# Patient Record
Sex: Female | Born: 1955 | Race: Black or African American | Hispanic: No | Smoking: Former smoker
Health system: Southern US, Community
[De-identification: ages and names within clinical notes are randomized; demographics above are authoritative.]

## PROBLEM LIST (undated history)

## (undated) DIAGNOSIS — I10 Essential (primary) hypertension: Secondary | ICD-10-CM

## (undated) DIAGNOSIS — E669 Obesity, unspecified: Secondary | ICD-10-CM

## (undated) DIAGNOSIS — E119 Type 2 diabetes mellitus without complications: Secondary | ICD-10-CM

## (undated) DIAGNOSIS — J449 Chronic obstructive pulmonary disease, unspecified: Secondary | ICD-10-CM

## (undated) DIAGNOSIS — J45909 Unspecified asthma, uncomplicated: Secondary | ICD-10-CM

## (undated) HISTORY — PX: DILATION AND CURETTAGE OF UTERUS: SHX78

---

## 2014-11-06 ENCOUNTER — Emergency Department (HOSPITAL_COMMUNITY): Payer: Medicaid Other

## 2014-11-06 ENCOUNTER — Encounter (HOSPITAL_COMMUNITY): Payer: Self-pay | Admitting: Emergency Medicine

## 2014-11-06 DIAGNOSIS — Z87891 Personal history of nicotine dependence: Secondary | ICD-10-CM | POA: Insufficient documentation

## 2014-11-06 DIAGNOSIS — R000 Tachycardia, unspecified: Secondary | ICD-10-CM | POA: Diagnosis not present

## 2014-11-06 DIAGNOSIS — Z862 Personal history of diseases of the blood and blood-forming organs and certain disorders involving the immune mechanism: Secondary | ICD-10-CM | POA: Diagnosis not present

## 2014-11-06 DIAGNOSIS — J441 Chronic obstructive pulmonary disease with (acute) exacerbation: Secondary | ICD-10-CM | POA: Insufficient documentation

## 2014-11-06 DIAGNOSIS — E119 Type 2 diabetes mellitus without complications: Secondary | ICD-10-CM | POA: Diagnosis not present

## 2014-11-06 DIAGNOSIS — Z7952 Long term (current) use of systemic steroids: Secondary | ICD-10-CM | POA: Diagnosis not present

## 2014-11-06 DIAGNOSIS — R0602 Shortness of breath: Secondary | ICD-10-CM | POA: Diagnosis present

## 2014-11-06 DIAGNOSIS — E669 Obesity, unspecified: Secondary | ICD-10-CM | POA: Insufficient documentation

## 2014-11-06 DIAGNOSIS — R112 Nausea with vomiting, unspecified: Secondary | ICD-10-CM | POA: Insufficient documentation

## 2014-11-06 DIAGNOSIS — I10 Essential (primary) hypertension: Secondary | ICD-10-CM | POA: Insufficient documentation

## 2014-11-06 LAB — CBC WITH DIFFERENTIAL/PLATELET
BASOS PCT: 0 %
Basophils Absolute: 0.1 10*3/uL (ref 0.0–0.1)
EOS ABS: 0.1 10*3/uL (ref 0.0–0.7)
Eosinophils Relative: 1 %
HCT: 45.1 % (ref 36.0–46.0)
HEMOGLOBIN: 15 g/dL (ref 12.0–15.0)
LYMPHS ABS: 1.4 10*3/uL (ref 0.7–4.0)
Lymphocytes Relative: 6 %
MCH: 27.6 pg (ref 26.0–34.0)
MCHC: 33.3 g/dL (ref 30.0–36.0)
MCV: 83.1 fL (ref 78.0–100.0)
MONO ABS: 1.5 10*3/uL — AB (ref 0.1–1.0)
MONOS PCT: 7 %
NEUTROS PCT: 86 %
Neutro Abs: 19.1 10*3/uL — ABNORMAL HIGH (ref 1.7–7.7)
Platelets: 221 10*3/uL (ref 150–400)
RBC: 5.43 MIL/uL — ABNORMAL HIGH (ref 3.87–5.11)
RDW: 15.4 % (ref 11.5–15.5)
WBC: 22.1 10*3/uL — ABNORMAL HIGH (ref 4.0–10.5)

## 2014-11-06 LAB — BASIC METABOLIC PANEL
Anion gap: 13 (ref 5–15)
BUN: 15 mg/dL (ref 6–20)
CALCIUM: 8.8 mg/dL — AB (ref 8.9–10.3)
CHLORIDE: 96 mmol/L — AB (ref 101–111)
CO2: 23 mmol/L (ref 22–32)
CREATININE: 0.81 mg/dL (ref 0.44–1.00)
GFR calc Af Amer: >60 mL/min (ref >60)
GFR calc non Af Amer: >60 mL/min (ref >60)
GLUCOSE: 317 mg/dL — AB (ref 65–99)
Potassium: 4.5 mmol/L (ref 3.5–5.1)
Sodium: 132 mmol/L — ABNORMAL LOW (ref 135–145)

## 2014-11-06 NOTE — ED Notes (Signed)
Pt. presents with multiple complaints : SOB with productive cough , nausea , emesis , diarrhea , pain at buttocks and right facial numbness  onset this afternoon . Denies fever or chills , alert and oriented . No facial symmetry /speech clear.

## 2014-11-07 ENCOUNTER — Emergency Department (HOSPITAL_COMMUNITY)
Admission: EM | Admit: 2014-11-07 | Discharge: 2014-11-07 | Disposition: A | Discharge status: Home / Self Care | Payer: Medicaid Other | Attending: Emergency Medicine | Admitting: Emergency Medicine

## 2014-11-07 DIAGNOSIS — J441 Chronic obstructive pulmonary disease with (acute) exacerbation: Secondary | ICD-10-CM

## 2014-11-07 HISTORY — DX: Essential (primary) hypertension: I10

## 2014-11-07 HISTORY — DX: Chronic obstructive pulmonary disease, unspecified: J44.9

## 2014-11-07 HISTORY — DX: Unspecified asthma, uncomplicated: J45.909

## 2014-11-07 HISTORY — DX: Obesity, unspecified: E66.9

## 2014-11-07 HISTORY — DX: Type 2 diabetes mellitus without complications: E11.9

## 2014-11-07 MED ORDER — IPRATROPIUM BROMIDE 0.02 % IN SOLN
1.0000 mg | Freq: Once | RESPIRATORY_TRACT | Status: AC
  Administered 2014-11-07: 1 mg via RESPIRATORY_TRACT
  Filled 2014-11-07: qty 5

## 2014-11-07 MED ORDER — AZITHROMYCIN 250 MG PO TABS
500.0000 mg | ORAL_TABLET | Freq: Once | ORAL | Status: AC
  Administered 2014-11-07: 500 mg via ORAL
  Filled 2014-11-07: qty 2

## 2014-11-07 MED ORDER — PREDNISONE 20 MG PO TABS
60.0000 mg | ORAL_TABLET | Freq: Once | ORAL | Status: AC
  Administered 2014-11-07: 60 mg via ORAL
  Filled 2014-11-07: qty 3

## 2014-11-07 MED ORDER — SODIUM CHLORIDE 0.9 % IV BOLUS (SEPSIS)
1000.0000 mL | Freq: Once | INTRAVENOUS | Status: AC
  Administered 2014-11-07: 1000 mL via INTRAVENOUS

## 2014-11-07 MED ORDER — ONDANSETRON HCL 4 MG/2ML IJ SOLN
4.0000 mg | Freq: Once | INTRAMUSCULAR | Status: AC
  Administered 2014-11-07: 4 mg via INTRAVENOUS
  Filled 2014-11-07: qty 2

## 2014-11-07 MED ORDER — MAGNESIUM SULFATE 2 GM/50ML IV SOLN
2.0000 g | Freq: Once | INTRAVENOUS | Status: AC
  Administered 2014-11-07: 2 g via INTRAVENOUS
  Filled 2014-11-07: qty 50

## 2014-11-07 MED ORDER — AZITHROMYCIN 250 MG PO TABS: 250.0000 mg | ORAL_TABLET | Freq: Every day | ORAL | Status: DC

## 2014-11-07 MED ORDER — PREDNISONE 20 MG PO TABS: 60.0000 mg | ORAL_TABLET | Freq: Every day | ORAL | Status: DC

## 2014-11-07 MED ORDER — ALBUTEROL (5 MG/ML) CONTINUOUS INHALATION SOLN
10.0000 mg/h | INHALATION_SOLUTION | RESPIRATORY_TRACT | Status: DC
  Administered 2014-11-07: 10 mg/h via RESPIRATORY_TRACT
  Filled 2014-11-07: qty 20

## 2014-11-07 NOTE — ED Provider Notes (Addendum)
CSN: [id number]     Arrival date & time 11/06/14  2204 History  By signing my name below, I, Cassandra Shea, attest that this documentation has been prepared under the direction and in the presence of [name], MD. Electronically Signed: Budd Shea, ED Scribe. 11/07/2014. 1:29 AM.    Chief Complaint  Patient presents with  . Shortness of Breath  . Emesis  . Diarrhea   The history is provided by the patient. No language interpreter was used.   HPI Comments: Cassandra Shea is a 59 y.o. female former smoker with a PMHX of DM, HTN, COPD, and asthma who presents to the Emergency Department complaining of worsening SOB, emesis (1x yesterday), and nausea onset today. She reports associated cough (worse than usual) and mild central chest tightness. She states she is not sure if this is her COPD flaring up. 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 She states she takes prednisone once daily. She reports having used her inhaler today. She notes she is not on oxygen at home. Pt denies diaphoresis.  Past Medical History  Diagnosis Date  . COPD (chronic obstructive pulmonary disease) (HCC)   . Diabetes mellitus without complication (HCC)   . Hypertension   . Asthma   . Obesity    Past Surgical History  Procedure Laterality Date  . Dilation and curettage of uterus     No family history on file. Social History  Substance Use Topics  . Smoking status: Former Games developer  . Smokeless tobacco: None  . Alcohol Use: No   OB History    No data available     Review of Systems A complete 10 system review of systems was obtained and all systems are negative except as noted in the HPI and PMH.   Allergies  Review of patient's allergies indicates no known allergies.  Home Medications   Prior to Admission medications   Not on File   BP 143/90 mmHg  Pulse 114  Temp(Src) 99.4 F (37.4 C) (Oral)  Resp 18  Ht  (1.499 m)  Wt  227 lb (102.967 kg)  BMI 45.82 kg/m2  SpO2 95% Physical Exam  Constitutional: She is oriented to person, place, and time. She appears well-developed and well-nourished. No distress.  HENT:  Head: Normocephalic and atraumatic.  Nose: Nose normal.  Mouth/Throat: Oropharynx is clear and moist. No oropharyngeal exudate.  Eyes: Conjunctivae and EOM are normal. Pupils are equal, round, and reactive to light. No scleral icterus.  Neck: Normal range of motion. Neck supple. No JVD present. No tracheal deviation present. No thyromegaly present.  Cardiovascular: Regular rhythm.  Exam reveals no gallop and no friction rub.   No murmur heard. Tachycardic  Pulmonary/Chest: Effort normal. No respiratory distress. She has wheezes. She exhibits no tenderness.  Diffuse rhonchi and wheezing in all lung fields  Abdominal: Soft. Bowel sounds are normal. She exhibits no distension and no mass. There is no tenderness. There is no rebound and no guarding.  Musculoskeletal: Normal range of motion. She exhibits no edema or tenderness.  Lymphadenopathy:    She has no cervical adenopathy.  Neurological: She is alert and oriented to person, place, and time. No cranial nerve deficit. She exhibits normal muscle tone.  Skin: Skin is warm and dry. No rash noted. No erythema. No pallor.  Nursing note and vitals reviewed.   ED Course  Procedures  DIAGNOSTIC STUDIES: Oxygen Saturation is 95%  on RA, adequate by my interpretation.    COORDINATION OF CARE: 1:21 AM - Discussed plans to order a breathing treatment and increase prednisone to 60 mg for 6 days. Pt advised of plan for treatment and pt agrees.  Labs Review Labs Reviewed  BASIC METABOLIC PANEL - Abnormal; Notable for the following:    Sodium 132 (*)    Chloride 96 (*)    Glucose, Bld 317 (*)    Calcium 8.8 (*)    All other components within normal limits  CBC WITH DIFFERENTIAL/PLATELET - Abnormal; Notable for the following:    WBC 22.1 (*)    RBC 5.43  (*)    Neutro Abs 19.1 (*)    Monocytes Absolute 1.5 (*)    All other components within normal limits    Imaging Review Dg Chest 2 View  11/06/2014  CLINICAL DATA:  Shortness of Breath EXAM: CHEST - 2 VIEW COMPARISON:  None. FINDINGS: The heart size and mediastinal contours are within normal limits. Both lungs are clear. The visualized skeletal structures are unremarkable. IMPRESSION: No active disease. Electronically Signed   By: [name]  [name]   On: 11/06/2014 23:19   I have personally reviewed and evaluated these images and lab results as part of my medical decision-making.   EKG Interpretation   Date/Time:  Friday November 07 2014 01:06:55 EDT Ventricular Rate:  115 PR Interval:  161 QRS Duration: 83 QT Interval:  346 QTC Calculation: 479 R Axis:   95 Text Interpretation:  Sinus tachycardia Anterior infarct, old No  significant change since last tracing Confirmed by [name], [name] Ayokunle  [phone]([id number]) on 11/07/2014 1:54:05 AM      MDM   Final diagnoses:  None    Patient presents to the ED for worsening SOB she feels is consistent with her COPD.  Physical exam supports this.  She was given 1hr albuterol, ipratropium, magnesium, IVF, prednisone and azithromycin.  Upon repeat evaluation, wheezing and rhonchi have significantly improved although still present.  She appears well and states she is at her baseline. No hypoxia in the ED.  Will Dc home with prednisone and zpack course.  Her VS remain within her normal limits and she is safe for DC. Patient now tachycardic, likely from albuterol  CRITICAL CARE Performed by: Tomasita CrumbleNI,Marieke Lubke   Total critical care time: 35min - respiratory distress  Critical care time was exclusive of separately billable procedures and treating other patients.  Critical care was necessary to treat or prevent imminent or life-threatening deterioration.  Critical care was time spent personally by me on the following activities: development of treatment  plan with patient and/or surrogate as well as nursing, discussions with consultants, evaluation of patient's response to treatment, examination of patient, obtaining history from patient or surrogate, ordering and performing treatments and interventions, ordering and review of laboratory studies, ordering and review of radiographic studies, pulse oximetry and re-evaluation of patient's condition.  I, Abdulahad Mederos, personally performed the services described in this documentation. All medical record entries made by the scribe were at my direction and in my presence.  I have reviewed the chart and discharge instructions and agree that the record reflects my personal performance and is accurate and complete. Orhan Mayorga.  11/07/2014. 4:30 AM.     Tomasita CrumbleAdeleke Ranie Chinchilla, MD 11/07/14 [id number]  Tomasita CrumbleAdeleke Nassir Neidert, MD 11/07/14 [phone]

## 2014-11-07 NOTE — Discharge Instructions (Signed)
Chronic Obstructive Pulmonary Disease Exacerbation Cassandra Shea, See your primary care doctor within 3 days.  Continue albuterol inhaler every 6 hours for the next 2 days.  Take prednisone and azithromycin as prescribed.  If symptoms worsen, come back to the ED immediately.  Thank you. Chronic obstructive pulmonary disease (COPD) is a common lung problem. In COPD, the flow of air from the lungs is limited. COPD exacerbations are times that breathing gets worse and you need extra treatment. Without treatment they can be life threatening. If they happen often, your lungs can become more damaged. If your COPD gets worse, your doctor may treat you with:  Medicines.  Oxygen.  Different ways to clear your airway, such as using a mask. HOME CARE  Do not smoke.  Avoid tobacco smoke and other things that bother your lungs.  If given, take your antibiotic medicine as told. Finish the medicine even if you start to feel better.  Only take medicines as told by your doctor.  Drink enough fluids to keep your pee (urine) clear or pale yellow (unless your doctor has told you not to).  Use a cool mist machine (vaporizer).  If you use oxygen or a machine that turns liquid medicine into a mist (nebulizer), continue to use them as told.  Keep up with shots (vaccinations) as told by your doctor.  Exercise regularly.  Eat healthy foods.  Keep all doctor visits as told. GET HELP RIGHT AWAY IF:  You are very short of breath and it gets worse.  You have trouble talking.  You have bad chest pain.  You have blood in your spit (sputum).  You have a fever.  You keep throwing up (vomiting).  You feel weak, or you pass out (faint).  You feel confused.  You keep getting worse. MAKE SURE YOU:  Understand these instructions.  Will watch your condition.  Will get help right away if you are not doing well or get worse.   This information is not intended to replace advice given to you by your  health care provider. Make sure you discuss any questions you have with your health care provider.   Document Released: 12/23/2010 Document Revised: 01/24/2014 Document Reviewed: 09/07/2012 Elsevier Interactive Patient Education [employer]2016 [employer] Inc.

## 2014-11-07 NOTE — ED Notes (Signed)
IV attempt X2 

## 2014-11-07 NOTE — ED Notes (Signed)
Reviewed discharge instructions and prescription medications with patient. Pt verbalized understanding.

## 2014-11-14 ENCOUNTER — Inpatient Hospital Stay: Payer: Medicaid Other | Admitting: Family Medicine

## 2019-04-18 ENCOUNTER — Emergency Department (HOSPITAL_COMMUNITY): Payer: Medicare Other

## 2019-04-18 ENCOUNTER — Observation Stay (HOSPITAL_COMMUNITY)
Admission: EM | Admit: 2019-04-18 | Discharge: 2019-04-19 | Disposition: A | Discharge status: Home / Self Care | Payer: Medicare Other | Attending: Family Medicine | Admitting: Family Medicine

## 2019-04-18 ENCOUNTER — Other Ambulatory Visit: Payer: Self-pay

## 2019-04-18 DIAGNOSIS — I160 Hypertensive urgency: Secondary | ICD-10-CM | POA: Insufficient documentation

## 2019-04-18 DIAGNOSIS — Z7984 Long term (current) use of oral hypoglycemic drugs: Secondary | ICD-10-CM | POA: Insufficient documentation

## 2019-04-18 DIAGNOSIS — Z20822 Contact with and (suspected) exposure to covid-19: Secondary | ICD-10-CM | POA: Diagnosis not present

## 2019-04-18 DIAGNOSIS — I719 Aortic aneurysm of unspecified site, without rupture: Secondary | ICD-10-CM | POA: Diagnosis not present

## 2019-04-18 DIAGNOSIS — Z87891 Personal history of nicotine dependence: Secondary | ICD-10-CM | POA: Diagnosis not present

## 2019-04-18 DIAGNOSIS — E119 Type 2 diabetes mellitus without complications: Secondary | ICD-10-CM | POA: Insufficient documentation

## 2019-04-18 DIAGNOSIS — M542 Cervicalgia: Secondary | ICD-10-CM | POA: Diagnosis present

## 2019-04-18 DIAGNOSIS — I771 Stricture of artery: Secondary | ICD-10-CM | POA: Diagnosis not present

## 2019-04-18 DIAGNOSIS — Z79899 Other long term (current) drug therapy: Secondary | ICD-10-CM | POA: Diagnosis not present

## 2019-04-18 DIAGNOSIS — J449 Chronic obstructive pulmonary disease, unspecified: Secondary | ICD-10-CM | POA: Diagnosis not present

## 2019-04-18 DIAGNOSIS — I701 Atherosclerosis of renal artery: Secondary | ICD-10-CM

## 2019-04-18 DIAGNOSIS — I10 Essential (primary) hypertension: Secondary | ICD-10-CM | POA: Diagnosis not present

## 2019-04-18 LAB — BASIC METABOLIC PANEL
Anion gap: 12 (ref 5–15)
BUN: 13 mg/dL (ref 8–23)
CO2: 24 mmol/L (ref 22–32)
Calcium: 9.4 mg/dL (ref 8.9–10.3)
Chloride: 99 mmol/L (ref 98–111)
Creatinine, Ser: 0.96 mg/dL (ref 0.44–1.00)
GFR calc Af Amer: >60 mL/min (ref >60)
GFR calc non Af Amer: >60 mL/min (ref >60)
Glucose, Bld: 313 mg/dL — ABNORMAL HIGH (ref 70–99)
Potassium: 4.4 mmol/L (ref 3.5–5.1)
Sodium: 135 mmol/L (ref 135–145)

## 2019-04-18 LAB — CBC
HCT: 40.9 % (ref 36.0–46.0)
Hemoglobin: 12.7 g/dL (ref 12.0–15.0)
MCH: 25.2 pg — ABNORMAL LOW (ref 26.0–34.0)
MCHC: 31.1 g/dL (ref 30.0–36.0)
MCV: 81.3 fL (ref 80.0–100.0)
Platelets: 291 10*3/uL (ref 150–400)
RBC: 5.03 MIL/uL (ref 3.87–5.11)
RDW: 14.9 % (ref 11.5–15.5)
WBC: 17.6 10*3/uL — ABNORMAL HIGH (ref 4.0–10.5)
nRBC: 0 % (ref 0.0–0.2)

## 2019-04-18 LAB — TROPONIN I (HIGH SENSITIVITY)
Troponin I (High Sensitivity): 5 ng/L (ref <18)
Troponin I (High Sensitivity): 6 ng/L (ref <18)

## 2019-04-18 MED ORDER — LABETALOL HCL 5 MG/ML IV SOLN
10.0000 mg | Freq: Once | INTRAVENOUS | Status: AC
  Administered 2019-04-18: 10 mg via INTRAVENOUS
  Filled 2019-04-18: qty 4

## 2019-04-18 MED ORDER — METHOCARBAMOL 500 MG PO TABS
500.0000 mg | ORAL_TABLET | Freq: Once | ORAL | Status: AC
Start: 2019-04-18 — End: 2019-04-18
  Administered 2019-04-18: 500 mg via ORAL
  Filled 2019-04-18: qty 1

## 2019-04-18 MED ORDER — FENTANYL CITRATE (PF) 100 MCG/2ML IJ SOLN
50.0000 ug | Freq: Once | INTRAMUSCULAR | Status: AC
  Administered 2019-04-18: 50 ug via INTRAVENOUS
  Filled 2019-04-18: qty 2

## 2019-04-18 MED ORDER — HYDROMORPHONE HCL 1 MG/ML IJ SOLN
0.5000 mg | Freq: Once | INTRAMUSCULAR | Status: AC
  Administered 2019-04-18: 0.5 mg via INTRAVENOUS
  Filled 2019-04-18: qty 1

## 2019-04-18 NOTE — ED Triage Notes (Signed)
Pt reports neck pain/upper back pain and spasms X3 days. Pt noted to be hypertensive in triage. Denies CP/SOB.

## 2019-04-18 NOTE — ED Provider Notes (Signed)
[facility] EMERGENCY DEPARTMENT Provider Note   CSN: [id number] Arrival date & time: 04/18/19  1744     History Chief Complaint  Patient presents with  . Neck Pain    Cassandra Shea is a 64 y.o. female.  The history is provided by the patient.  Neck Pain Associated symptoms: no weakness   Patient presents with neck pain.  Has had for around the last 3 days.  And upper neck and to the left side.  Worse with moving her head around.  No trauma.  No numbness or weakness.  Has had some muscle spasm in the past and feels it feels like this.  No chest pain.  No trouble breathing.  However does have it elevated blood pressure.  Does have a history of hypertension.  States she has been taking her medicines.  Has taken Tylenol and Advil at home without relief of the pain.     Past Medical History:  Diagnosis Date  . Asthma   . COPD (chronic obstructive pulmonary disease) (HCC)   . Diabetes mellitus without complication (HCC)   . Hypertension   . Obesity     There are no problems to display for this patient.   Past Surgical History:  Procedure Laterality Date  . DILATION AND CURETTAGE OF UTERUS       OB History   No obstetric history on file.     No family history on file.  Social History   Tobacco Use  . Smoking status: Former Smoker  Substance Use Topics  . Alcohol use: No  . Drug use: No    Home Medications Prior to Admission medications   Medication Sig Start Date End Date Taking? Authorizing Provider  carvedilol (COREG) 25 MG tablet Take 25 mg by mouth 2 (two) times daily. 10/12/14  Yes [provider]  gabapentin (NEURONTIN) 300 MG capsule Take 300 mg by mouth 3 (three) times daily. 10/15/14  Yes [provider]  glipiZIDE (GLUCOTROL XL) 10 MG 24 hr tablet Take 10 mg by mouth 2 (two) times daily. 04/01/19  Yes [provider]  hydrALAZINE (APRESOLINE) 10 MG tablet Take 10 mg by mouth 3 (three) times daily. 10/07/14   Yes [provider]  metFORMIN (GLUCOPHAGE) 1000 MG tablet Take 1,000 mg by mouth 2 (two) times daily. 09/26/14  Yes [provider]  PROAIR HFA 108 (90 BASE) MCG/ACT inhaler Take 2 puffs by mouth every 6 (six) hours as needed for wheezing.  10/30/14  Yes [provider]  QVAR 80 MCG/ACT inhaler Take 1 puff by mouth 2 (two) times daily. 10/29/14  Yes [provider]  rosuvastatin (CRESTOR) 10 MG tablet Take 10 mg by mouth daily. 01/31/19  Yes [provider]  TRADJENTA 5 MG TABS tablet Take 5 mg by mouth daily. 04/02/19  Yes [provider]  azithromycin (ZITHROMAX) 250 MG tablet Take 1 tablet (250 mg total) by mouth daily. Patient not taking: Reported on 04/18/2019 11/07/14   [name], MD  glipiZIDE (GLUCOTROL) 10 MG tablet Take 10 mg by mouth 2 (two) times daily. 10/07/14   [provider]  predniSONE (DELTASONE) 20 MG tablet Take 3 tablets (60 mg total) by mouth daily. Patient not taking: Reported on 04/18/2019 11/07/14   [name], MD    Allergies    Patient has no known allergies.  Review of Systems   Review of Systems  Constitutional: Negative for appetite change.  HENT: Negative for congestion and dental problem.  Respiratory: Negative for shortness of breath.   Gastrointestinal: Negative for abdominal distention.  Genitourinary: Negative for flank pain.  Musculoskeletal: Positive for neck pain.  Skin: Negative for pallor.  Neurological: Negative for weakness.  Psychiatric/Behavioral: Negative for confusion.    Physical Exam Updated Vital Signs BP (!) 199/102   Pulse (!) 105   Temp 98.9 F (37.2 C) (Oral)   Resp 20   Ht 4\' 11"  (1.499 m)   Wt 103 kg   SpO2 94%   BMI 45.85 kg/m   Physical Exam Vitals and nursing note reviewed.  HENT:     Head: Normocephalic.  Eyes:     Pupils: Pupils are equal, round, and reactive to light.  Cardiovascular:     Rate and Rhythm: Regular rhythm.  Pulmonary:     Breath  sounds: No wheezing or rhonchi.  Abdominal:     Tenderness: There is no abdominal tenderness.  Musculoskeletal:     Comments: Tenderness over upper cervical spine paraspinal area.  Some pain with range of motion.  No meningismus.  Skin:    General: Skin is warm.  Neurological:     General: No focal deficit present.     Mental Status: She is alert and oriented to person, place, and time.     ED Results / Procedures / Treatments   Labs (all labs ordered are listed, but only abnormal results are displayed) Labs Reviewed  BASIC METABOLIC PANEL - Abnormal; Notable for the following components:      Result Value   Glucose, Bld 313 (*)    All other components within normal limits  CBC - Abnormal; Notable for the following components:   WBC 17.6 (*)    MCH 25.2 (*)    All other components within normal limits  TROPONIN I (HIGH SENSITIVITY)  TROPONIN I (HIGH SENSITIVITY)    EKG EKG Interpretation  Date/Time:  Thursday April 18 2019 17:46:40 EDT Ventricular Rate:  110 PR Interval:  178 QRS Duration: 98 QT Interval:  338 QTC Calculation: 457 R Axis:   95 Text Interpretation: Sinus tachycardia Rightward axis Possible Anterior infarct , age undetermined Abnormal ECG Confirmed by 11-06-2004 [phone]) on 04/18/2019 6:52:16 PM   Radiology DG Chest 2 View  Result Date: 04/18/2019 CLINICAL DATA:  Hypertension and neck pain EXAM: CHEST - 2 VIEW COMPARISON:  03/19/2017 FINDINGS: Cardiac shadow is stable. Aortic calcifications are again seen. The lungs are well aerated bilaterally. No focal infiltrate or sizable effusion is seen. No acute bony abnormality is noted. IMPRESSION: No acute abnormality noted. Aortic Atherosclerosis (ICD10-I70.0). Electronically Signed   By: 05/19/2017 M.D.   On: 04/18/2019 19:30    Procedures Procedures (including critical care time)  Medications Ordered in ED Medications  labetalol (NORMODYNE) injection 10 mg (has no administration in time range)   methocarbamol (ROBAXIN) tablet 500 mg (500 mg Oral Given 04/18/19 1937)  fentaNYL (SUBLIMAZE) injection 50 mcg (50 mcg Intravenous Given 04/18/19 2051)  labetalol (NORMODYNE) injection 10 mg (10 mg Intravenous Given 04/18/19 2052)    ED Course  I have reviewed the triage vital signs and the nursing notes.  Pertinent labs & imaging results that were available during my care of the patient were reviewed by me and considered in my medical decision making (see chart for details).    MDM Rules/Calculators/A&P                      Patient with neck pain.  Think more likely musculoskeletal.  Pain with movement.  No fever no trauma.  No cancer history.  However white count is elevated.  Continued pain unrelieved with fentanyl and Robaxin.  Also moderate to severely elevated blood pressure.  Up to 220/123.  Feels the patient benefit from Nora to the hospital for blood pressure control and pain control.  Will discuss with unassigned medicine for Final Clinical Impression(s) / ED Diagnoses Final diagnoses:  Neck pain  Hypertension, unspecified type    Rx / DC Orders ED Discharge Orders    None       Davonna Belling, MD 04/18/19 2251

## 2019-04-18 NOTE — ED Notes (Signed)
Pt ambulated to br with no assistance.  

## 2019-04-18 NOTE — H&P (Addendum)
[facility] Admission History and Physical Service Pager: [phone]  Patient name: Cassandra Shea Medical record number: [id number] Date of birth: [birth year] Age: 64 y.o. Gender: female  Primary Care Provider: Raina Mina., MD Consultants: None Code Status: Full Code Preferred Emergency Contact: Randall Hiss, Son [phone]  Chief Complaint: left sided neck pain   Assessment and Plan: Cassandra Shea is a 64 y.o. female presenting with bilateral posterior neck pain. PMH is significant for CVA, obesity, HTN and DM.   Hypertensive Crisis  Patient presents with acute/subacutely(?) elevated blood pressure, max 224/128. BP has ranged 188-224/93-128. Patient denies any headache but does report pain in the occiput of her head more so on the left side, posteriorly. Patient reports having chronically blurry vision. She reports that blood pressure in the office are usually within normal limits and she does not have a home blood pressure cuff. She reports compliance with her home antihypertensives which include hydralazine 29m TID and coreg 230mBID both of which she reports taking prior to presenting to the ED. Patient does not have any signs of end organ failure; she denies chest pain, headaches/vision changes, changes in urinary frequency, volume. Her neuro exam is non-focal and is A&Ox4. Initial labs significant for Cr 0.96, BUN 13, Troponin 6>5, and COVID negative. In the ED, patient received 1098mf labetalol x2 with minimal lowering of her blood pressure.  Patient attributes elevated BP to pain however pain has improved after dilaudid + fentanyl with no improvement in her blood pressure.  Differential for acute/subacute asymptomatic hypertension intracerebral or subarachnoid hemorrhage, infarct. Will obtain CT head to rule out these causes. Patient has history of infrarenal aortic aneurysm which is monitored closely outpatient. On review of PCP notes, aneurysm  appeared stable with repeat imaging as of 06/18/2018 and plans to re-check annually. She denies any back pain, abdominal pain today.  We do not have any reports from previous images, thus have no comparison. Will order [place] least invasive. Can consider obtaining CT abdomen to identify any potential tears or abnormalities given severely elevated Bps. Cardiology from 2017 mentions right renal artery stenosis incidentally found on CT scan with an acquired atrophic kidney.  Patient's blood pressures have noted to be stable at recent office visits.  Cardiologist recommended renal duplex which was never obtained. Will obtain renal duplex US.[place] also consifer f/u w/ CTA as above. Glomerular disease unlikely with normal creatinine of 0.96 and BUN of 13. Unlikely to be MAHA given normal CBC. Patient not on MAOI. Higher on differential is sudden cessation of antihypertensive drugs. Will continue home antihypertensives at this time. Hypoaldosteronism less likely given no met alkalosis & normal K. As patient did not respond to labetolol, provided clonidine x 1. Patient will be admitted for blood pressure management and pain control.  -Admit to medical telemetry, attending Dr. ChaErin Hearinggive clonidine 0.1mg23mcontinue labetalol 10 mg q 10 minutes for acute control -continue to monitor BP, goal SBP >180  -cardiac monitoring x48 hours  -vitals q 4 hours -CT Head  -Carotid, aorta doppler, renal duplex ultrasounds   - AM CMP, CBC with diff  -continue home Coreg 25 BID  -continue home hydralazine 10mg31m  Bilateral Posterior Neck Pain  Patient presents with left sided neck pain for 2-3 days. Patient was given fentanyl and robaxin in the ED with subjective improvement from when she initially presented. She denies any recent trauma to her neck but states that she may have slept in an odd  position on the night prior to waking with the neck pain. On exam, patient has some tenderness with rotating her head towards her  right side with left sided, posterior neck pain. She has no tenderness with flexion or extension of the neck. There are no signs of trauma to either side of her neck. In the absence of red flags such as fever, trauma, lower extremity weakness or neurological signs, or prolonged (>6 weeks of neck pain), it is likely that this pain is musculoskeletal in nature (including cervical strain or osteoarthritis). However, as the patient does reports a history of CVA and has history of aortic calcifications on imaging, it is necessary to obtain head and neck imaging to rule out carotid occlusion/stenosis putting the patient at increased risk for CVA.   -admit to medical telemetry, attending Dr. Erin Hearing  -CT head without contrast  -carotid ultrasound  -up with assistance  -Tylenol PRN   Chronic Leukocytosis  Patient does not have any signs of infection today.  On chart review, appears that patient has chronic leukocytosis with no apparent work-up.  Unlikely to be cause of hypertension or neck pain.  Patient should follow-up outpatient. -Outpatient follow-up  DM Patient has history of diabetes. Home medications include glipizide twice daily, metformin and Tradjenta. She reports taking these medications prior to admission. Patient's last hemoglobin A1c was 6.9 on 01/14/2019. BG on admission elevated in the 300s.  -hbg A1c -CBG checks with me -start with sensitive sliding scale insulin   -CBG QID WC & QHS  HLD  Patient is prescribed rosuvastatin 37m daily. Prior lipid panel in 12/2018 showed normal cholesterol, LDL and HDL with elevated TG of 179. Consider increasing to 20 mg daily.  -continue home statin daily   COPD Patient with no respiratory complaints on admission. Home medications include ProAir and QVAR.  -continue home inhalers ProAir and Pulmicort  -maintain oxygen saturations >88%  -monitor RR with vitals signs   FEN/GI: carb modified/heart healthy diet  Prophylaxis: Lovenox    Disposition: admit to medical telemetry   History of Present Illness:  Cassandra Shea a 64y.o. female presenting with neck pain bilaterally that radiates upwards towards her head. She reports trouble sleeping due to pain. Patient states that she was in her normal state of health until; 2-3 days ago when a pain in her neck caused her to wake from her sleep. Since then she reports that pain has been worsening. She states that the pain at first felt like a migraine but then seemed to settle in her neck. The quality of the pain is aching. Patient denies any recent trauma but state that she  possibly slept in an awkward position. She has pain with turning towards her right side. Patient reports that this has happened before in the setting of migraine headache. She denies headache at this time.    Improved with pain medication, patient reports "a lot better" as she can lie down now. She denies any dizziness or worsening vision, stating that her vision is blurry at a baseline.   ED course included fentanyl 50 mcg, dilaudid 0.557m robaxin and two 10 mg doses of labetalol for blood pressure elevated to 224/128. BP was 195/102 after last administration of labetalol.    Review Of Systems: Per HPI with the following additions:   Review of Systems  Constitutional: Negative for fever.  HENT: Negative for sore throat.   Eyes: Negative for redness.       Baseline, chronic blurry vision  Respiratory:       Chronic SOB due to COPD/asthma diagnoses  Cardiovascular: Negative for chest pain.  Gastrointestinal: Negative for nausea.  Musculoskeletal: Positive for neck pain.  Neurological: Positive for headaches. Negative for weakness.       Initially pain felt like migraine     Patient Active Problem List   Diagnosis Date Noted  . Severe hypertension 04/18/2019    Past Medical History: Past Medical History:  Diagnosis Date  . Asthma   . COPD (chronic obstructive pulmonary disease) ([place])   .  Diabetes mellitus without complication ([place])   . Hypertension   . Obesity     Past Surgical History: Past Surgical History:  Procedure Laterality Date  . DILATION AND CURETTAGE OF UTERUS      Social History: Social History   Tobacco Use  . Smoking status: Former Smoker  Substance Use Topics  . Alcohol use: No  . Drug use: No    Family History: No family history on file.  Allergies and Medications: No Known Allergies No current facility-administered medications on file prior to encounter.   Current Outpatient Medications on File Prior to Encounter  Medication Sig Dispense Refill  . carvedilol (COREG) 25 MG tablet Take 25 mg by mouth 2 (two) times daily.  1  . gabapentin (NEURONTIN) 300 MG capsule Take 300 mg by mouth 3 (three) times daily.  1  . glipiZIDE (GLUCOTROL XL) 10 MG 24 hr tablet Take 10 mg by mouth 2 (two) times daily.    . hydrALAZINE (APRESOLINE) 10 MG tablet Take 10 mg by mouth 3 (three) times daily.  0  . metFORMIN (GLUCOPHAGE) 1000 MG tablet Take 1,000 mg by mouth 2 (two) times daily.  1  . PROAIR HFA 108 (90 BASE) MCG/ACT inhaler Take 2 puffs by mouth every 6 (six) hours as needed for wheezing.   5  . QVAR 80 MCG/ACT inhaler Take 1 puff by mouth 2 (two) times daily.  3  . rosuvastatin (CRESTOR) 10 MG tablet Take 10 mg by mouth daily.    . TRADJENTA 5 MG TABS tablet Take 5 mg by mouth daily.      Objective: BP (!) 215/114 (BP Location: Left Arm)   Pulse (!) 102   Temp 98.4 F (36.9 C) (Oral)   Resp 20   Ht 4' 11" (1.499 m)   Wt 95 kg   SpO2 93%   BMI 42.29 kg/m   Exam: General: obese female, actively changing position to sit up in bed, uncomfortable when moving neck but in no respiratory distress  Eyes: no conjunctival injection, EOMI bilaterally, unable to complete fundoscopic exam  Neck: tenderness >on left posterior neck palpation, muscle tense to palpation, tenderness with complete 90 deg rotation towards patient's right side, able to  demonstrate full flexion and extension without difficulty, no bruising or signs of trauma  Cardiovascular: tachycardia, no murmur appreciated on exam, weak bilateral radial pulses & DPs Respiratory: CTAB, mildly decreased breath sounds bilater, stable on RA Gastrointestinal: obese abdomen, bowel sounds normal  Derm: no lesions or ulcerations noted on exam  Neuro: alert, oriented to self, location, year, state, DOB and president; demonstrates normal ROM of upper and lower extremities, neck ROM noted above  Psych: cooperative with exam questions, some responses to questions seem mildly delayed, thought content appears appropriate and linear   Labs and Imaging: CBC BMET  Recent Labs  Lab 04/18/19 1907  WBC 17.6*  HGB 12.7  HCT 40.9  PLT 291  Recent Labs  Lab 04/18/19 1907  NA 135  K 4.4  CL 99  CO2 24  BUN 13  CREATININE 0.96  GLUCOSE 313*  CALCIUM 9.4     EKG: questionable old infarct, some inverted t-waves   DG Chest 2 View  Result Date: 04/18/2019 CLINICAL DATA:  Hypertension and neck pain EXAM: CHEST - 2 VIEW COMPARISON:  03/19/2017 FINDINGS: Cardiac shadow is stable. Aortic calcifications are again seen. The lungs are well aerated bilaterally. No focal infiltrate or sizable effusion is seen. No acute bony abnormality is noted. IMPRESSION: No acute abnormality noted. Aortic Atherosclerosis (ICD10-I70.0). Electronically Signed   By: Inez Catalina M.D.   On: 04/18/2019 19:30   CT HEAD WO CONTRAST  Result Date: 04/19/2019 CLINICAL DATA:  Neck pain and headaches for several days EXAM: CT HEAD WITHOUT CONTRAST TECHNIQUE: Contiguous axial images were obtained from the base of the skull through the vertex without intravenous contrast. COMPARISON:  10/26/2014 FINDINGS: Brain: There are encephalomalacia changes identified in the left occipital lobe consistent with prior infarct. These are new from the prior exam. Previously seen lacunar infarcts within the basal ganglia are slightly more  prominent on the current exam. No acute hemorrhage or acute infarct is seen. Vascular: No hyperdense vessel or unexpected calcification. Skull: Normal. Negative for fracture or focal lesion. Sinuses/Orbits: No acute finding. Other: None. IMPRESSION: Chronic changes without acute abnormality. Electronically Signed   By: Inez Catalina M.D.   On: 04/19/2019 02:02    Stark Klein, MD 04/19/2019, 3:42 AM PGY-1, [place] [facility] pager: [phone], text pages welcome

## 2019-04-19 ENCOUNTER — Observation Stay (HOSPITAL_BASED_OUTPATIENT_CLINIC_OR_DEPARTMENT_OTHER): Payer: Medicare Other

## 2019-04-19 ENCOUNTER — Observation Stay (HOSPITAL_COMMUNITY): Payer: Medicare Other

## 2019-04-19 DIAGNOSIS — I10 Essential (primary) hypertension: Secondary | ICD-10-CM | POA: Diagnosis not present

## 2019-04-19 DIAGNOSIS — M542 Cervicalgia: Secondary | ICD-10-CM

## 2019-04-19 DIAGNOSIS — I701 Atherosclerosis of renal artery: Secondary | ICD-10-CM | POA: Diagnosis not present

## 2019-04-19 LAB — COMPREHENSIVE METABOLIC PANEL
ALT: 16 U/L (ref 0–44)
AST: 12 U/L — ABNORMAL LOW (ref 15–41)
Albumin: 3.5 g/dL (ref 3.5–5.0)
Alkaline Phosphatase: 100 U/L (ref 38–126)
Anion gap: 12 (ref 5–15)
BUN: 11 mg/dL (ref 8–23)
CO2: 24 mmol/L (ref 22–32)
Calcium: 9.4 mg/dL (ref 8.9–10.3)
Chloride: 99 mmol/L (ref 98–111)
Creatinine, Ser: 0.87 mg/dL (ref 0.44–1.00)
GFR calc Af Amer: >60 mL/min (ref >60)
GFR calc non Af Amer: >60 mL/min (ref >60)
Glucose, Bld: 253 mg/dL — ABNORMAL HIGH (ref 70–99)
Potassium: 3.9 mmol/L (ref 3.5–5.1)
Sodium: 135 mmol/L (ref 135–145)
Total Bilirubin: 0.4 mg/dL (ref 0.3–1.2)
Total Protein: 6.9 g/dL (ref 6.5–8.1)

## 2019-04-19 LAB — CBC WITH DIFFERENTIAL/PLATELET
Abs Immature Granulocytes: 0.1 10*3/uL — ABNORMAL HIGH (ref 0.00–0.07)
Basophils Absolute: 0 10*3/uL (ref 0.0–0.1)
Basophils Relative: 0 %
Eosinophils Absolute: 0 10*3/uL (ref 0.0–0.5)
Eosinophils Relative: 0 %
HCT: 38.3 % (ref 36.0–46.0)
Hemoglobin: 12 g/dL (ref 12.0–15.0)
Immature Granulocytes: 1 %
Lymphocytes Relative: 26 %
Lymphs Abs: 4.7 10*3/uL — ABNORMAL HIGH (ref 0.7–4.0)
MCH: 25.3 pg — ABNORMAL LOW (ref 26.0–34.0)
MCHC: 31.3 g/dL (ref 30.0–36.0)
MCV: 80.8 fL (ref 80.0–100.0)
Monocytes Absolute: 1.6 10*3/uL — ABNORMAL HIGH (ref 0.1–1.0)
Monocytes Relative: 9 %
Neutro Abs: 11.4 10*3/uL — ABNORMAL HIGH (ref 1.7–7.7)
Neutrophils Relative %: 64 %
Platelets: 287 10*3/uL (ref 150–400)
RBC: 4.74 MIL/uL (ref 3.87–5.11)
RDW: 14.8 % (ref 11.5–15.5)
WBC: 17.9 10*3/uL — ABNORMAL HIGH (ref 4.0–10.5)
nRBC: 0 % (ref 0.0–0.2)

## 2019-04-19 LAB — GLUCOSE, CAPILLARY
Glucose-Capillary: 235 mg/dL — ABNORMAL HIGH (ref 70–99)
Glucose-Capillary: 231 mg/dL — ABNORMAL HIGH (ref 70–99)
Glucose-Capillary: 243 mg/dL — ABNORMAL HIGH (ref 70–99)
Glucose-Capillary: 255 mg/dL — ABNORMAL HIGH (ref 70–99)

## 2019-04-19 LAB — HIV ANTIBODY (ROUTINE TESTING W REFLEX): HIV Screen 4th Generation wRfx: NONREACTIVE

## 2019-04-19 LAB — HEMOGLOBIN A1C
Hgb A1c MFr Bld: 7.8 % — ABNORMAL HIGH (ref 4.8–5.6)
Mean Plasma Glucose: 177.16 mg/dL

## 2019-04-19 LAB — SARS CORONAVIRUS 2 (TAT 6-24 HRS): SARS Coronavirus 2: NEGATIVE

## 2019-04-19 MED ORDER — LABETALOL HCL 5 MG/ML IV SOLN
10.0000 mg | INTRAVENOUS | Status: DC | PRN
  Filled 2019-04-19: qty 4

## 2019-04-19 MED ORDER — TRAMADOL HCL 50 MG PO TABS: 50.0000 mg | ORAL_TABLET | Freq: Four times a day (QID) | ORAL | 0 refills | Status: AC | PRN

## 2019-04-19 MED ORDER — ENOXAPARIN SODIUM 40 MG/0.4ML ~~LOC~~ SOLN
40.0000 mg | SUBCUTANEOUS | Status: DC
  Administered 2019-04-19: 40 mg via SUBCUTANEOUS
  Filled 2019-04-19 (×2): qty 0.4

## 2019-04-19 MED ORDER — ACETAMINOPHEN 650 MG RE SUPP: 650.0000 mg | Freq: Four times a day (QID) | RECTAL | Status: DC | PRN

## 2019-04-19 MED ORDER — CARVEDILOL 25 MG PO TABS: 25.0000 mg | ORAL_TABLET | Freq: Two times a day (BID) | ORAL | Status: DC

## 2019-04-19 MED ORDER — BUDESONIDE 0.25 MG/2ML IN SUSP
0.2500 mg | Freq: Two times a day (BID) | RESPIRATORY_TRACT | Status: DC
  Filled 2019-04-19: qty 2

## 2019-04-19 MED ORDER — LINAGLIPTIN 5 MG PO TABS
5.0000 mg | ORAL_TABLET | Freq: Every day | ORAL | Status: DC
  Administered 2019-04-19: 5 mg via ORAL
  Filled 2019-04-19: qty 1

## 2019-04-19 MED ORDER — CARVEDILOL 25 MG PO TABS
25.0000 mg | ORAL_TABLET | Freq: Two times a day (BID) | ORAL | Status: DC
  Administered 2019-04-19: 25 mg via ORAL
  Filled 2019-04-19: qty 1

## 2019-04-19 MED ORDER — HYDRALAZINE HCL 10 MG PO TABS: 10.0000 mg | ORAL_TABLET | Freq: Three times a day (TID) | ORAL | Status: DC

## 2019-04-19 MED ORDER — ACETAMINOPHEN 325 MG PO TABS
650.0000 mg | ORAL_TABLET | Freq: Four times a day (QID) | ORAL | Status: DC | PRN
  Administered 2019-04-19: 650 mg via ORAL
  Filled 2019-04-19: qty 2

## 2019-04-19 MED ORDER — INSULIN ASPART 100 UNIT/ML ~~LOC~~ SOLN: 0.0000 [IU] | Freq: Three times a day (TID) | SUBCUTANEOUS | Status: DC

## 2019-04-19 MED ORDER — GLIPIZIDE ER 5 MG PO TB24: 10.0000 mg | ORAL_TABLET | Freq: Two times a day (BID) | ORAL | Status: DC

## 2019-04-19 MED ORDER — CLONIDINE HCL 0.1 MG PO TABS
0.1000 mg | ORAL_TABLET | Freq: Two times a day (BID) | ORAL | Status: DC
  Administered 2019-04-19: 0.1 mg via ORAL
  Filled 2019-04-19: qty 1

## 2019-04-19 MED ORDER — CLONIDINE HCL 0.1 MG PO TABS: 0.1000 mg | ORAL_TABLET | Freq: Two times a day (BID) | ORAL | Status: DC

## 2019-04-19 MED ORDER — HYDRALAZINE HCL 10 MG PO TABS
10.0000 mg | ORAL_TABLET | Freq: Three times a day (TID) | ORAL | Status: DC
  Filled 2019-04-19: qty 1

## 2019-04-19 MED ORDER — ROSUVASTATIN CALCIUM 5 MG PO TABS
10.0000 mg | ORAL_TABLET | Freq: Every day | ORAL | Status: DC
  Administered 2019-04-19: 10 mg via ORAL
  Filled 2019-04-19: qty 2

## 2019-04-19 MED ORDER — ALBUTEROL SULFATE (2.5 MG/3ML) 0.083% IN NEBU
2.5000 mg | INHALATION_SOLUTION | Freq: Four times a day (QID) | RESPIRATORY_TRACT | Status: DC | PRN
  Administered 2019-04-19: 2.5 mg via RESPIRATORY_TRACT
  Filled 2019-04-19: qty 3

## 2019-04-19 MED ORDER — GABAPENTIN 300 MG PO CAPS
300.0000 mg | ORAL_CAPSULE | Freq: Three times a day (TID) | ORAL | Status: DC
  Administered 2019-04-19: 300 mg via ORAL
  Filled 2019-04-19: qty 1

## 2019-04-19 MED FILL — traMADol HCL 50 MG TABS: 50 | 2 days supply | Qty: 8 | Fill #0

## 2019-04-19 NOTE — Progress Notes (Signed)
Results for KERYL, GHOLSON (MRN [id number]) as of 04/19/2019 13:16  Ref. Range 04/19/2019 01:07 04/19/2019 08:19 04/19/2019 12:11  Glucose-Capillary Latest Ref Range: 70 - 99 mg/dL 245 (H) 809 (H) 983 (H)  Noted that blood sugars have been greater than 200 mg/dl.  Recommend adding Novolog 3 units TID with meals if patient eats at least 50% of meal and if blood sugars continue to be elevated.  Smith Mince RN BSN CDE Diabetes Coordinator Pager: [phone]  8am-5pm

## 2019-04-19 NOTE — Progress Notes (Signed)
Carotid artery and renal artery duplex exams completed.  Preliminary results can be found under CV proc under chart review.  04/19/2019 10:15 AM  Arbie Blankley, K., RDMS, RVT

## 2019-04-19 NOTE — Plan of Care (Signed)
Pt d/cd to home with self care, education completed, follow up appointment explained to pt with verbal understanding received, left facility via W/C accompanied by family member.

## 2019-04-19 NOTE — Discharge Instructions (Signed)
 Hypertension, Adult High blood pressure (hypertension) is when the force of blood pumping through the arteries is too strong. The arteries are the blood vessels that carry blood from the heart throughout the body. Hypertension forces the heart to work harder to pump blood and may cause arteries to become narrow or stiff. Untreated or uncontrolled hypertension can cause a heart attack, heart failure, a stroke, kidney disease, and other problems. A blood pressure reading consists of a higher number over a lower number. Ideally, your blood pressure should be below 120/80. The first ("top") number is called the systolic pressure. It is a measure of the pressure in your arteries as your heart beats. The second ("bottom") number is called the diastolic pressure. It is a measure of the pressure in your arteries as the heart relaxes. What are the causes? The exact cause of this condition is not known. There are some conditions that result in or are related to high blood pressure. What increases the risk? Some risk factors for high blood pressure are under your control. The following factors may make you more likely to develop this condition:  Smoking.  Having type 2 diabetes mellitus, high cholesterol, or both.  Not getting enough exercise or physical activity.  Being overweight.  Having too much fat, sugar, calories, or salt (sodium) in your diet.  Drinking too much alcohol. Some risk factors for high blood pressure may be difficult or impossible to change. Some of these factors include:  Having chronic kidney disease.  Having a family history of high blood pressure.  Age. Risk increases with age.  Race. You may be at higher risk if you are African American.  Gender. Men are at higher risk than women before age 45. After age 65, women are at higher risk than men.  Having obstructive sleep apnea.  Stress. What are the signs or symptoms? High blood pressure may not cause symptoms. Very  high blood pressure (hypertensive crisis) may cause:  Headache.  Anxiety.  Shortness of breath.  Nosebleed.  Nausea and vomiting.  Vision changes.  Severe chest pain.  Seizures. How is this diagnosed? This condition is diagnosed by measuring your blood pressure while you are seated, with your arm resting on a flat surface, your legs uncrossed, and your feet flat on the floor. The cuff of the blood pressure monitor will be placed directly against the skin of your upper arm at the level of your heart. It should be measured at least twice using the same arm. Certain conditions can cause a difference in blood pressure between your right and left arms. Certain factors can cause blood pressure readings to be lower or higher than normal for a short period of time:  When your blood pressure is higher when you are in a health care provider's office than when you are at home, this is called white coat hypertension. Most people with this condition do not need medicines.  When your blood pressure is higher at home than when you are in a health care provider's office, this is called masked hypertension. Most people with this condition may need medicines to control blood pressure. If you have a high blood pressure reading during one visit or you have normal blood pressure with other risk factors, you may be asked to:  Return on a different day to have your blood pressure checked again.  Monitor your blood pressure at home for 1 week or longer. If you are diagnosed with hypertension, you may have other blood   or imaging tests to help your health care provider understand your overall risk for other conditions. How is this treated? This condition is treated by making healthy lifestyle changes, such as eating healthy foods, exercising more, and reducing your alcohol intake. Your health care provider may prescribe medicine if lifestyle changes are not enough to get your blood pressure under control, and  if:  Your systolic blood pressure is above 130.  Your diastolic blood pressure is above 80. Your personal target blood pressure may vary depending on your medical conditions, your age, and other factors. Follow these instructions at home: Eating and drinking   Eat a diet that is high in fiber and potassium, and low in sodium, added sugar, and fat. An example eating plan is called the DASH (Dietary Approaches to Stop Hypertension) diet. To eat this way: ? Eat plenty of fresh fruits and vegetables. Try to fill one half of your plate at each meal with fruits and vegetables. ? Eat whole grains, such as whole-wheat pasta, brown rice, or whole-grain bread. Fill about one fourth of your plate with whole grains. ? Eat or drink low-fat dairy products, such as skim milk or low-fat yogurt. ? Avoid fatty cuts of meat, processed or cured meats, and poultry with skin. Fill about one fourth of your plate with lean proteins, such as fish, chicken without skin, beans, eggs, or tofu. ? Avoid pre-made and processed foods. These tend to be higher in sodium, added sugar, and fat.  Reduce your daily sodium intake. Most people with hypertension should eat less than 1,500 mg of sodium a day.  Do not drink alcohol if: ? Your health care provider tells you not to drink. ? You are pregnant, may be pregnant, or are planning to become pregnant.  If you drink alcohol: ? Limit how much you use to:  0-1 drink a day for women.  0-2 drinks a day for men. ? Be aware of how much alcohol is in your drink. In the [place] one drink equals one 12 oz bottle of beer (355 mL), one 5 oz glass of wine (148 mL), or one 1 oz glass of hard liquor (44 mL). Lifestyle   Work with your health care provider to maintain a healthy body weight or to lose weight. Ask what an ideal weight is for you.  Get at least 30 minutes of exercise most days of the week. Activities may include walking, swimming, or biking.  Include exercise to  strengthen your muscles (resistance exercise), such as Pilates or lifting weights, as part of your weekly exercise routine. Try to do these types of exercises for 30 minutes at least 3 days a week.  Do not use any products that contain nicotine or tobacco, such as cigarettes, e-cigarettes, and chewing tobacco. If you need help quitting, ask your health care provider.  Monitor your blood pressure at home as told by your health care provider.  Keep all follow-up visits as told by your health care provider. This is important. Medicines  Take over-the-counter and prescription medicines only as told by your health care provider. Follow directions carefully. Blood pressure medicines must be taken as prescribed.  Do not skip doses of blood pressure medicine. Doing this puts you at risk for problems and can make the medicine less effective.  Ask your health care provider about side effects or reactions to medicines that you should watch for. Contact a health care provider if you:  Think you are having a reaction to a medicine  you are taking.  Have headaches that keep coming back (recurring).  Feel dizzy.  Have swelling in your ankles.  Have trouble with your vision. Get help right away if you:  Develop a severe headache or confusion.  Have unusual weakness or numbness.  Feel faint.  Have severe pain in your chest or abdomen.  Vomit repeatedly.  Have trouble breathing. Summary  Hypertension is when the force of blood pumping through your arteries is too strong. If this condition is not controlled, it may put you at risk for serious complications.  Your personal target blood pressure may vary depending on your medical conditions, your age, and other factors. For most people, a normal blood pressure is less than 120/80.  Hypertension is treated with lifestyle changes, medicines, or a combination of both. Lifestyle changes include losing weight, eating a healthy, low-sodium diet,  exercising more, and limiting alcohol. This information is not intended to replace advice given to you by your health care provider. Make sure you discuss any questions you have with your health care provider. Document Revised: 09/13/2017 Document Reviewed: 09/13/2017 Elsevier Patient Education  2020 [employer] Your Hypertension Hypertension is commonly called high blood pressure. This is when the force of your blood pressing against the walls of your arteries is too strong. Arteries are blood vessels that carry blood from your heart throughout your body. Hypertension forces the heart to work harder to pump blood, and may cause the arteries to become narrow or stiff. Having untreated or uncontrolled hypertension can cause heart attack, stroke, kidney disease, and other problems. What are blood pressure readings? A blood pressure reading consists of a higher number over a lower number. Ideally, your blood pressure should be below 120/80. The first ("top") number is called the systolic pressure. It is a measure of the pressure in your arteries as your heart beats. The second ("bottom") number is called the diastolic pressure. It is a measure of the pressure in your arteries as the heart relaxes. What does my blood pressure reading mean? Blood pressure is classified into four stages. Based on your blood pressure reading, your health care provider may use the following stages to determine what type of treatment you need, if any. Systolic pressure and diastolic pressure are measured in a unit called mm Hg. Normal  Systolic pressure: below 244.  Diastolic pressure: below 80. Elevated  Systolic pressure: 010-272.  Diastolic pressure: below 80. Hypertension stage 1  Systolic pressure: 536-644.  Diastolic pressure: 03-47. Hypertension stage 2  Systolic pressure: 425 or above.  Diastolic pressure: 90 or above. What health risks are associated with hypertension? Managing your  hypertension is an important responsibility. Uncontrolled hypertension can lead to:  A heart attack.  A stroke.  A weakened blood vessel (aneurysm).  Heart failure.  Kidney damage.  Eye damage.  Metabolic syndrome.  Memory and concentration problems. What changes can I make to manage my hypertension? Hypertension can be managed by making lifestyle changes and possibly by taking medicines. Your health care provider will help you make a plan to bring your blood pressure within a normal range. Eating and drinking   Eat a diet that is high in fiber and potassium, and low in salt (sodium), added sugar, and fat. An example eating plan is called the DASH (Dietary Approaches to Stop Hypertension) diet. To eat this way: ? Eat plenty of fresh fruits and vegetables. Try to fill half of your plate at each meal with fruits and vegetables. ?  Eat whole grains, such as whole wheat pasta, brown rice, or whole grain bread. Fill about one quarter of your plate with whole grains. ? Eat low-fat diary products. ? Avoid fatty cuts of meat, processed or cured meats, and poultry with skin. Fill about one quarter of your plate with lean proteins such as fish, chicken without skin, beans, eggs, and tofu. ? Avoid premade and processed foods. These tend to be higher in sodium, added sugar, and fat.  Reduce your daily sodium intake. Most people with hypertension should eat less than 1,500 mg of sodium a day.  Limit alcohol intake to no more than 1 drink a day for nonpregnant women and 2 drinks a day for men. One drink equals 12 oz of beer, 5 oz of wine, or 1 oz of hard liquor. Lifestyle  Work with your health care provider to maintain a healthy body weight, or to lose weight. Ask what an ideal weight is for you.  Get at least 30 minutes of exercise that causes your heart to beat faster (aerobic exercise) most days of the week. Activities may include walking, swimming, or biking.  Include exercise to  strengthen your muscles (resistance exercise), such as weight lifting, as part of your weekly exercise routine. Try to do these types of exercises for 30 minutes at least 3 days a week.  Do not use any products that contain nicotine or tobacco, such as cigarettes and e-cigarettes. If you need help quitting, ask your health care provider.  Control any long-term (chronic) conditions you have, such as high cholesterol or diabetes. Monitoring  Monitor your blood pressure at home as told by your health care provider. Your personal target blood pressure may vary depending on your medical conditions, your age, and other factors.  Have your blood pressure checked regularly, as often as told by your health care provider. Working with your health care provider  Review all the medicines you take with your health care provider because there may be side effects or interactions.  Talk with your health care provider about your diet, exercise habits, and other lifestyle factors that may be contributing to hypertension.  Visit your health care provider regularly. Your health care provider can help you create and adjust your plan for managing hypertension. Will I need medicine to control my blood pressure? Your health care provider may prescribe medicine if lifestyle changes are not enough to get your blood pressure under control, and if:  Your systolic blood pressure is 130 or higher.  Your diastolic blood pressure is 80 or higher. Take medicines only as told by your health care provider. Follow the directions carefully. Blood pressure medicines must be taken as prescribed. The medicine does not work as well when you skip doses. Skipping doses also puts you at risk for problems. Contact a health care provider if:  You think you are having a reaction to medicines you have taken.  You have repeated (recurrent) headaches.  You feel dizzy.  You have swelling in your ankles.  You have trouble with your  vision. Get help right away if:  You develop a severe headache or confusion.  You have unusual weakness or numbness, or you feel faint.  You have severe pain in your chest or abdomen.  You vomit repeatedly.  You have trouble breathing. Summary  Hypertension is when the force of blood pumping through your arteries is too strong. If this condition is not controlled, it may put you at risk for serious complications.  Your  personal target blood pressure may vary depending on your medical conditions, your age, and other factors. For most people, a normal blood pressure is less than 120/80.  Hypertension is managed by lifestyle changes, medicines, or both. Lifestyle changes include weight loss, eating a healthy, low-sodium diet, exercising more, and limiting alcohol. This information is not intended to replace advice given to you by your health care provider. Make sure you discuss any questions you have with your health care provider. Document Revised: 04/27/2018 Document Reviewed: 12/02/2015 Elsevier Patient Education  [employer]. Thank you for allowing [place] to be part of your care.  You were admitted for high blood pressure (likely in the setting of your pain and missing some medication doses) and neck pain, we believe your neck pain is likely muscular in origin.  Due to the severity, we will send you with a short supply of pain medication to help.  We recommend that you use Tylenol 500 mg every 4-6 hours and if needed can use tramadol for breakthrough/severe pain. Ice/heat 20 minutes at a time can help as well with topical therapies.  It is important for you to take your blood pressure medication as prescribed.  Follow up with your primary care doctor to discussed your BP medications and ways to help manage your neck pain.   Hypertension, Adult Hypertension is another name for high blood pressure. High blood pressure forces your heart to work harder to pump blood. This can cause  problems over time. There are two numbers in a blood pressure reading. There is a top number (systolic) over a bottom number (diastolic). It is best to have a blood pressure that is below 120/80. Healthy choices can help lower your blood pressure, or you may need medicine to help lower it. What are the causes? The cause of this condition is not known. Some conditions may be related to high blood pressure. What increases the risk?  Smoking.  Having type 2 diabetes mellitus, high cholesterol, or both.  Not getting enough exercise or physical activity.  Being overweight.  Having too much fat, sugar, calories, or salt (sodium) in your diet.  Drinking too much alcohol.  Having long-term (chronic) kidney disease.  Having a family history of high blood pressure.  Age. Risk increases with age.  Race. You may be at higher risk if you are African American.  Gender. Men are at higher risk than women before age 62. After age 56, women are at higher risk than men.  Having obstructive sleep apnea.  Stress. What are the signs or symptoms?  High blood pressure may not cause symptoms. Very high blood pressure (hypertensive crisis) may cause: ? Headache. ? Feelings of worry or nervousness (anxiety). ? Shortness of breath. ? Nosebleed. ? A feeling of being sick to your stomach (nausea). ? Throwing up (vomiting). ? Changes in how you see. ? Very bad chest pain. ? Seizures. How is this treated?  This condition is treated by making healthy lifestyle changes, such as: ? Eating healthy foods. ? Exercising more. ? Drinking less alcohol.  Your health care provider may prescribe medicine if lifestyle changes are not enough to get your blood pressure under control, and if: ? Your top number is above 130. ? Your bottom number is above 80.  Your personal target blood pressure may vary. Follow these instructions at home: Eating and drinking   If told, follow the DASH eating plan. To  follow this plan: ? Fill one half of your plate at  each meal with fruits and vegetables. ? Fill one fourth of your plate at each meal with whole grains. Whole grains include whole-wheat pasta, brown rice, and whole-grain bread. ? Eat or drink low-fat dairy products, such as skim milk or low-fat yogurt. ? Fill one fourth of your plate at each meal with low-fat (lean) proteins. Low-fat proteins include fish, chicken without skin, eggs, beans, and tofu. ? Avoid fatty meat, cured and processed meat, or chicken with skin. ? Avoid pre-made or processed food.  Eat less than 1,500 mg of salt each day.  Do not drink alcohol if: ? Your doctor tells you not to drink. ? You are pregnant, may be pregnant, or are planning to become pregnant.  If you drink alcohol: ? Limit how much you use to:  0-1 drink a day for women.  0-2 drinks a day for men. ? Be aware of how much alcohol is in your drink. In the [place] one drink equals one 12 oz bottle of beer (355 mL), one 5 oz glass of wine (148 mL), or one 1 oz glass of hard liquor (44 mL). Lifestyle   Work with your doctor to stay at a healthy weight or to lose weight. Ask your doctor what the best weight is for you.  Get at least 30 minutes of exercise most days of the week. This may include walking, swimming, or biking.  Get at least 30 minutes of exercise that strengthens your muscles (resistance exercise) at least 3 days a week. This may include lifting weights or doing Pilates.  Do not use any products that contain nicotine or tobacco, such as cigarettes, e-cigarettes, and chewing tobacco. If you need help quitting, ask your doctor.  Check your blood pressure at home as told by your doctor.  Keep all follow-up visits as told by your doctor. This is important. Medicines  Take over-the-counter and prescription medicines only as told by your doctor. Follow directions carefully.  Do not skip doses of blood pressure medicine. The medicine does not  work as well if you skip doses. Skipping doses also puts you at risk for problems.  Ask your doctor about side effects or reactions to medicines that you should watch for. Contact a doctor if you:  Think you are having a reaction to the medicine you are taking.  Have headaches that keep coming back (recurring).  Feel dizzy.  Have swelling in your ankles.  Have trouble with your vision. Get help right away if you:  Get a very bad headache.  Start to feel mixed up (confused).  Feel weak or numb.  Feel faint.  Have very bad pain in your: ? Chest. ? Belly (abdomen).  Throw up more than once.  Have trouble breathing. Summary  Hypertension is another name for high blood pressure.  High blood pressure forces your heart to work harder to pump blood.  For most people, a normal blood pressure is less than 120/80.  Making healthy choices can help lower blood pressure. If your blood pressure does not get lower with healthy choices, you may need to take medicine. This information is not intended to replace advice given to you by your health care provider. Make sure you discuss any questions you have with your health care provider. Document Revised: 09/13/2017 Document Reviewed: 09/13/2017 Elsevier Patient Education  2020 [employer].

## 2019-04-19 NOTE — Hospital Course (Addendum)
Cassandra Shea is a 64 y.o. female who presented with bilateral posterior neck pain and severely elevated BP.   Hypertensive Crisis  Patient's BP was elevated on the day of admission to max of 224/128. Despite two doses of labetalol her BP minimally improved. Patient's home antihypertensives were given the day following admission which included hydralazine 10mg  TID and coreg 25mg  BID. The patient's blood pressure did not improve despite administration of pain medications so she was started on clonidine 0.1mg .  Blood pressure continues to improve.  Renal ultrasound was negative for severe stenosis.  She was restarted on her home medications and was discharged home with follow up with her PCP on Mon 04/05 at 9 am.  She was given strict return precautions.    Posterior Neck Pain  Patient presented with posterior neck pain in the setting of no trauma to this area but patient did report likely sleeping in an odd position prior to the onset of neck pain. She denied chest pain or worsened breathing (COPD at baseline) and did not report any arm pain on admission. In the ED she was given fentanyl and robaxin and this greatly improved her neck pain. The patient had a history of CVA and aortic calcifications so concern for vasculature abnormalities and carotid stenosis were investigated. The patient underwent carotid ultrasound which show minimal stenosis of the right and left ICA. She also underwent head CT which was negative for any acute abnormality. She was discharged home with Tramadol 50 mg q6h x2 days and will follow up with PCP for further management.

## 2019-04-19 NOTE — Progress Notes (Signed)
Called Family Medicine to advise pt's bp is still in the 200's systolic. Rcvd an order for clonodine

## 2019-04-22 NOTE — Discharge Summary (Signed)
Family Medicine Teaching [facility] Discharge Summary  Patient name: Cassandra Shea Medical record number: [id number] Date of birth: 10/28/1955 Age: 64 y.o. Gender: female Date of Admission: 04/18/2019  Date of Discharge: 04/19/2019 Admitting Physician: Nicki Guadalajara, MD  Primary Care Provider: Gordan Payment., MD Consultants: None  Indication for Hospitalization:  Hypertension Urgency  Discharge Diagnoses/Problem List:  Hypertension Neck pain  Disposition: Home  Discharge Condition: Stable  Discharge Exam: 04/19/2019  General: Alert and oriented, no apparent distress  Cardiovascular: RRR with no murmurs noted Respiratory: CTA bilaterally  Gastrointestinal: Bowel sounds present. No abdominal pain  Brief Hospital Course:  Cassandra Shea is a 64 y.o. female who presented with bilateral posterior neck pain and severely elevated BP.   Hypertensive Crisis  Patient's BP was elevated on the day of admission to max of 224/128. Despite two doses of labetalol her BP minimally improved. Patient's home antihypertensives were given the day following admission which included hydralazine 10mg  TID and coreg 25mg  BID. The patient's blood pressure did not improve despite administration of pain medications so she was started on clonidine 0.1mg .  Blood pressure continues to improve.  Renal ultrasound was negative for severe stenosis.  She was restarted on her home medications and was discharged home with follow up with her PCP on Mon 04/05 at 9 am.  She was given strict return precautions.    Posterior Neck Pain  Patient presented with posterior neck pain in the setting of no trauma to this area but patient did report likely sleeping in an odd position prior to the onset of neck pain. She denied chest pain or worsened breathing (COPD at baseline) and did not report any arm pain on admission. In the ED she was given fentanyl and robaxin and this greatly improved her neck pain. The patient had a  history of CVA and aortic calcifications so concern for vasculature abnormalities and carotid stenosis were investigated. The patient underwent carotid ultrasound which show minimal stenosis of the right and left ICA. She also underwent head CT which was negative for any acute abnormality. She was discharged home with Tramadol 50 mg q6h x2 days and will follow up with PCP for further management.  Issues for Follow Up:  1. Follow up BP check.  Ensure compliance with medications 2. Consider pain management consult for neck pain 3. HbA1c 7.8, continue to monitor   Significant Procedures: None  Significant Labs and Imaging:  Recent Labs  Lab 04/18/19 1907 04/19/19 0643  WBC 17.6* 17.9*  HGB 12.7 12.0  HCT 40.9 38.3  PLT 291 287   Recent Labs  Lab 04/18/19 1907 04/19/19 0643  NA 135 135  K 4.4 3.9  CL 99 99  CO2 24 24  GLUCOSE 313* 253*  BUN 13 11  CREATININE 0.96 0.87  CALCIUM 9.4 9.4  ALKPHOS  --  100  AST  --  12*  ALT  --  16  ALBUMIN  --  3.5    DG Chest 2 View  Result Date: 04/18/2019 CLINICAL DATA:  Hypertension and neck pain EXAM: CHEST - 2 VIEW COMPARISON:  03/19/2017 FINDINGS: IMPRESSION: No acute abnormality noted. Aortic Atherosclerosis (ICD10-I70.0). Electronically Signed   By: 06/18/2019 M.D.   On: 04/18/2019 19:30   CT HEAD WO CONTRAST  Result Date: 04/19/2019 CLINICAL DATA:  Neck pain and headaches for several days EXAM: CT HEAD WITHOUT CONTRAST TECHNIQUE: IMPRESSION: Chronic changes without acute abnormality. Electronically Signed   By: 06/18/2019 M.D.   On: 04/19/2019  02:02      Results/Tests Pending at Time of Discharge: None  Discharge Medications:  Allergies as of 04/19/2019   No Known Allergies     Medication List    TAKE these medications   carvedilol 25 MG tablet Commonly known as: COREG Take 25 mg by mouth 2 (two) times daily.   gabapentin 300 MG capsule Commonly known as: NEURONTIN Take 300 mg by mouth 3 (three) times daily.    glipiZIDE 10 MG 24 hr tablet Commonly known as: GLUCOTROL XL Take 10 mg by mouth 2 (two) times daily.   hydrALAZINE 10 MG tablet Commonly known as: APRESOLINE Take 10 mg by mouth 3 (three) times daily.   metFORMIN 1000 MG tablet Commonly known as: GLUCOPHAGE Take 1,000 mg by mouth 2 (two) times daily.   ProAir HFA 108 (90 Base) MCG/ACT inhaler Generic drug: albuterol Take 2 puffs by mouth every 6 (six) hours as needed for wheezing.   Qvar 80 MCG/ACT inhaler Generic drug: beclomethasone Take 1 puff by mouth 2 (two) times daily.   rosuvastatin 10 MG tablet Commonly known as: CRESTOR Take 10 mg by mouth daily.   Tradjenta 5 MG Tabs tablet Generic drug: linagliptin Take 5 mg by mouth daily.     ASK your doctor about these medications   traMADol 50 MG tablet Commonly known as: Ultram Take 1 tablet (50 mg total) by mouth every 6 (six) hours as needed for up to 2 days. Ask about: Should I take this medication?       Discharge Instructions: Please refer to Patient Instructions section of EMR for full details.  Patient was counseled important signs and symptoms that should prompt return to medical care, changes in medications, dietary instructions, activity restrictions, and follow up appointments.   Follow-Up Appointments: Follow-up Information    Raina Mina., MD. Daphane Shepherd on 04/22/2019.   Specialty: Internal Medicine Why: Appointment at 9 am Contact information: [place] [address] [phone]        [facility] .   Specialty: Vascular Surgery Contact information: [address] [id number] [place] [address] [place]          Carollee Leitz, MD 04/22/2019, 4:09 AM PGY-1, [place]

## 2020-11-05 ENCOUNTER — Other Ambulatory Visit: Payer: Self-pay | Admitting: Internal Medicine

## 2020-11-05 DIAGNOSIS — R921 Mammographic calcification found on diagnostic imaging of breast: Secondary | ICD-10-CM

## 2020-11-16 ENCOUNTER — Ambulatory Visit
Admission: RE | Admit: 2020-11-16 | Discharge: 2020-11-16 | Disposition: A | Discharge status: Home / Self Care | Payer: Medicare Other | Source: Ambulatory Visit | Attending: Internal Medicine | Admitting: Internal Medicine

## 2020-11-16 ENCOUNTER — Other Ambulatory Visit: Payer: Self-pay

## 2020-11-16 DIAGNOSIS — R921 Mammographic calcification found on diagnostic imaging of breast: Secondary | ICD-10-CM

## 2021-04-27 IMAGING — CR DG CHEST 2V
2 series · 2 of 2 positions shown · non-contrast
Comparison: 03/19/2017

CLINICAL DATA: Hypertension and neck pain

EXAM:
CHEST - 2 VIEW

[chest pa]
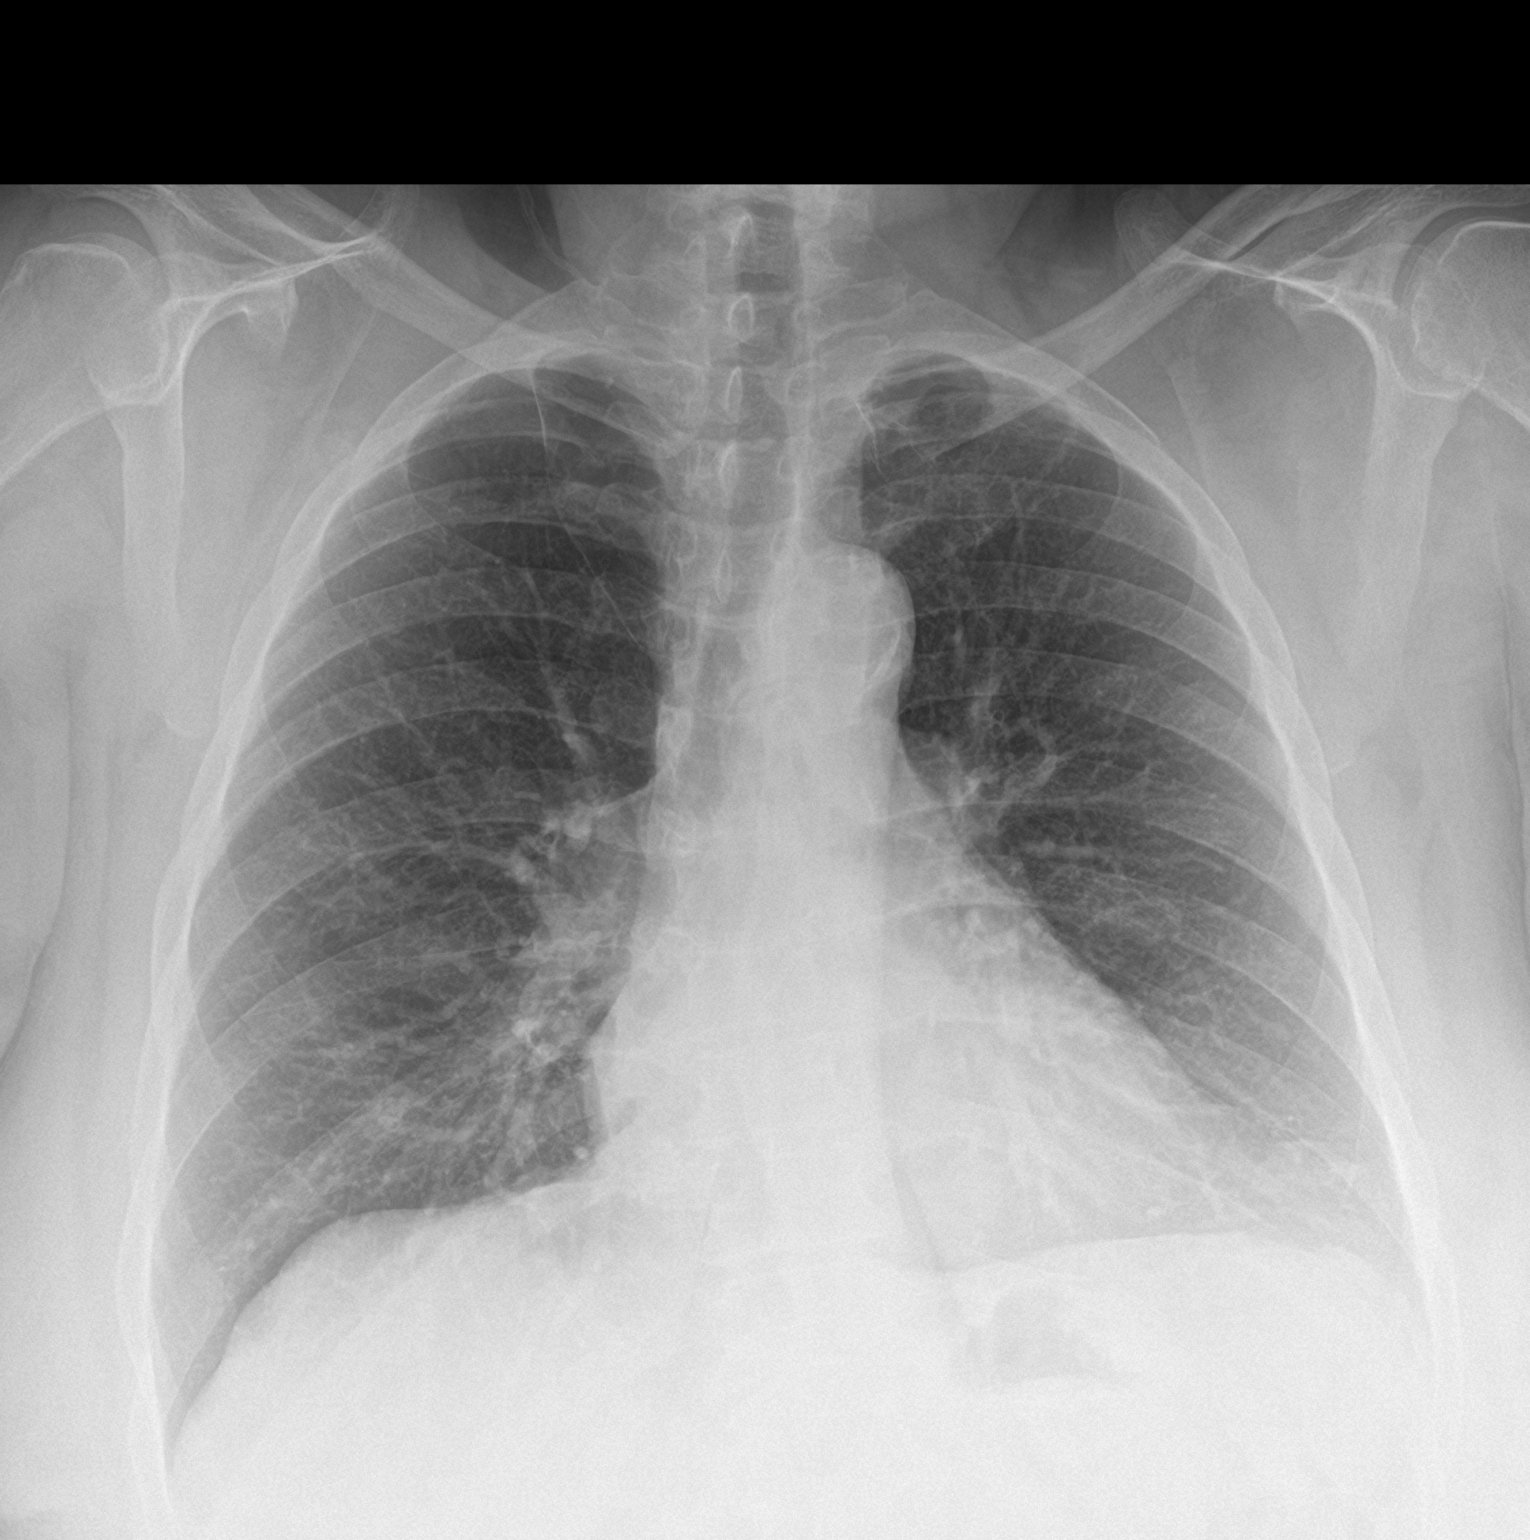

[chest lat]
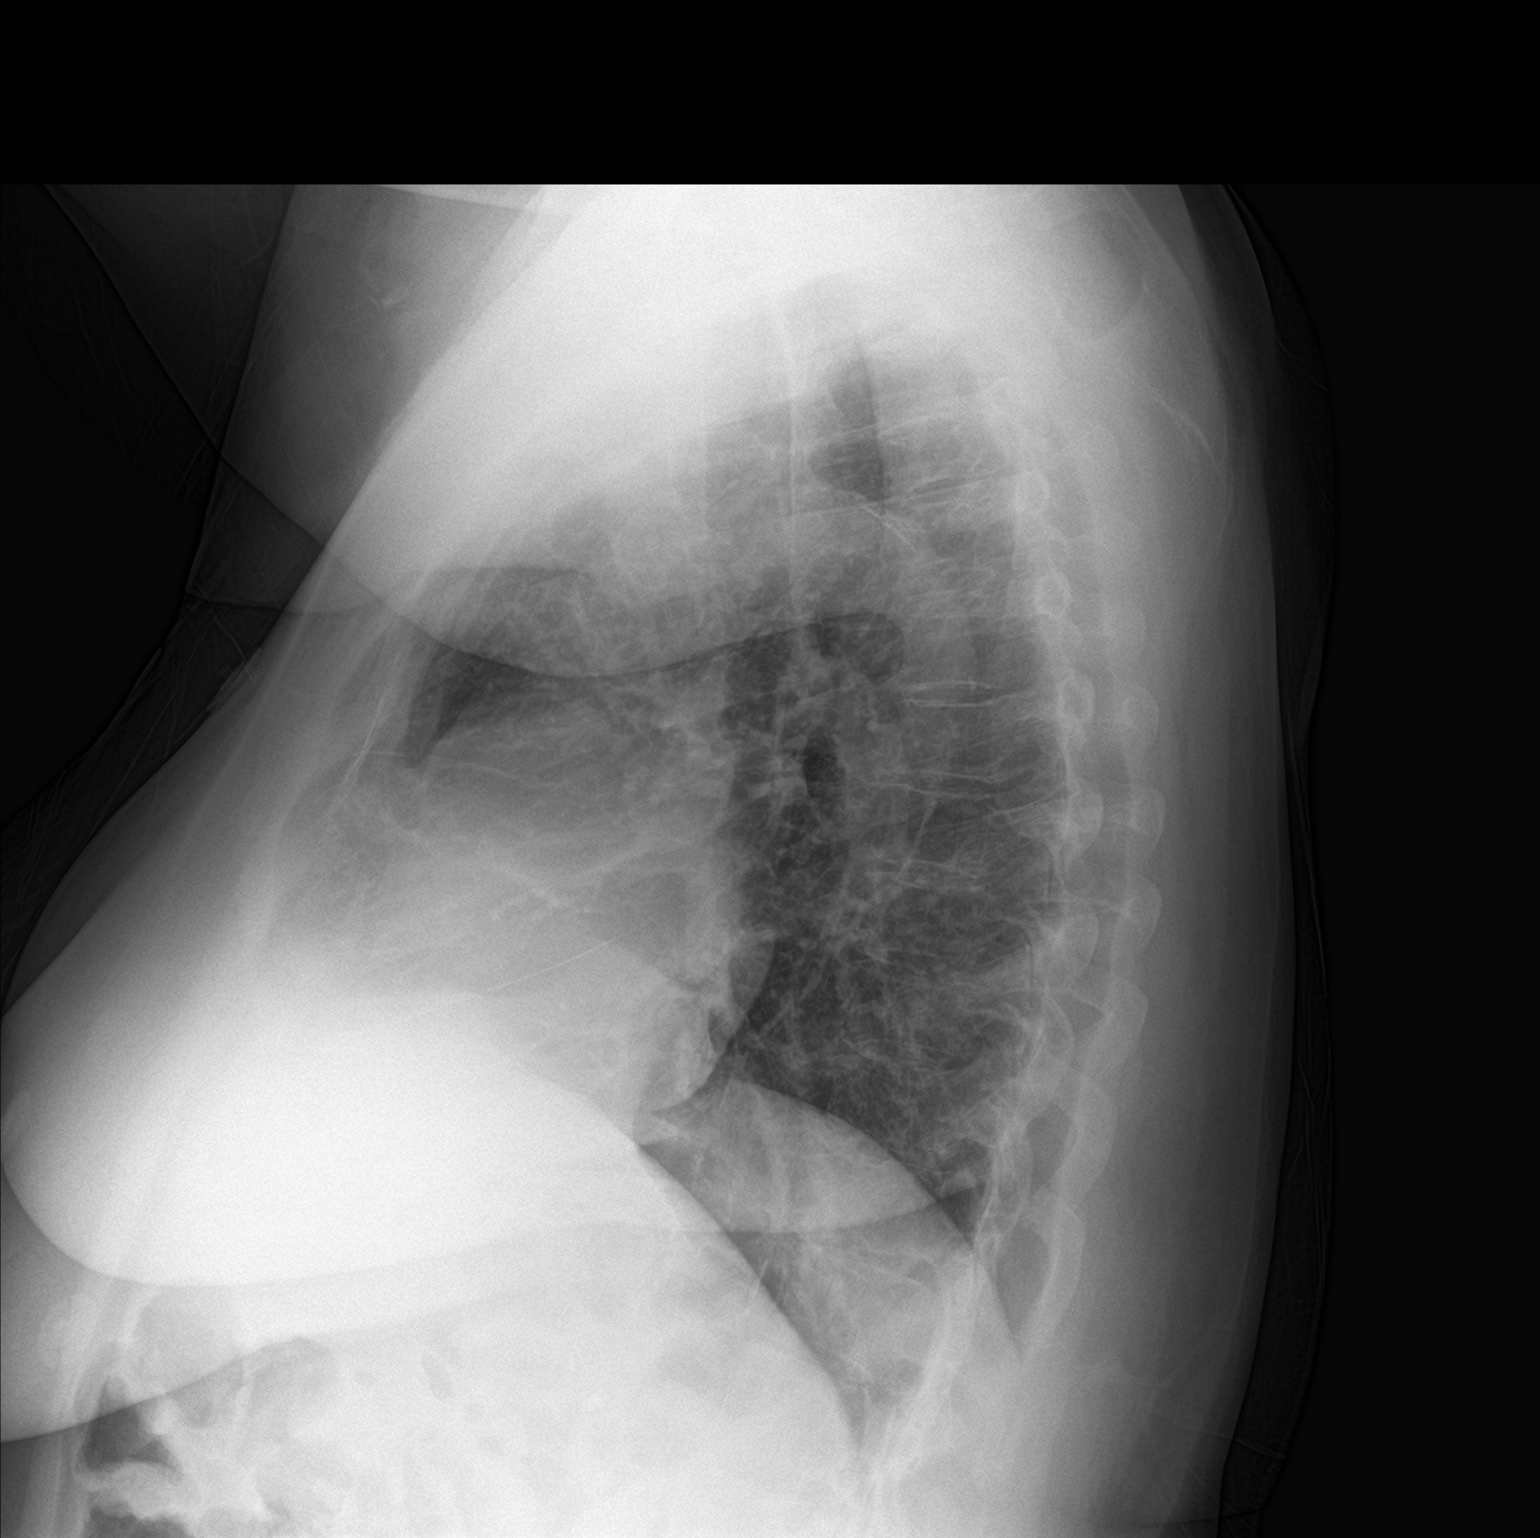

[2 of 2 positions shown; findings below may reference images not displayed]

FINDINGS: Cardiac shadow is stable. Aortic calcifications are again seen. The
lungs are well aerated bilaterally. No focal infiltrate or sizable
effusion is seen. No acute bony abnormality is noted.
IMPRESSION: No acute abnormality noted.

Aortic Atherosclerosis ([id number]).

## 2022-10-07 DIAGNOSIS — I517 Cardiomegaly: Secondary | ICD-10-CM | POA: Diagnosis not present

## 2022-10-07 DIAGNOSIS — I214 Non-ST elevation (NSTEMI) myocardial infarction: Secondary | ICD-10-CM | POA: Diagnosis not present

## 2022-10-07 DIAGNOSIS — J9620 Acute and chronic respiratory failure, unspecified whether with hypoxia or hypercapnia: Secondary | ICD-10-CM | POA: Diagnosis not present

## 2022-10-08 DIAGNOSIS — I214 Non-ST elevation (NSTEMI) myocardial infarction: Secondary | ICD-10-CM | POA: Diagnosis not present

## 2022-10-08 DIAGNOSIS — J9620 Acute and chronic respiratory failure, unspecified whether with hypoxia or hypercapnia: Secondary | ICD-10-CM | POA: Diagnosis not present

## 2022-10-10 DIAGNOSIS — I214 Non-ST elevation (NSTEMI) myocardial infarction: Secondary | ICD-10-CM | POA: Diagnosis not present

## 2022-10-11 DIAGNOSIS — E1142 Type 2 diabetes mellitus with diabetic polyneuropathy: Secondary | ICD-10-CM | POA: Diagnosis not present

## 2022-10-11 DIAGNOSIS — J9600 Acute respiratory failure, unspecified whether with hypoxia or hypercapnia: Secondary | ICD-10-CM | POA: Diagnosis not present

## 2022-10-11 DIAGNOSIS — R7989 Other specified abnormal findings of blood chemistry: Secondary | ICD-10-CM | POA: Diagnosis not present

## 2022-10-11 DIAGNOSIS — I509 Heart failure, unspecified: Secondary | ICD-10-CM

## 2022-10-12 DIAGNOSIS — E1142 Type 2 diabetes mellitus with diabetic polyneuropathy: Secondary | ICD-10-CM | POA: Diagnosis not present

## 2022-10-12 DIAGNOSIS — R7989 Other specified abnormal findings of blood chemistry: Secondary | ICD-10-CM | POA: Diagnosis not present

## 2022-10-12 DIAGNOSIS — J9600 Acute respiratory failure, unspecified whether with hypoxia or hypercapnia: Secondary | ICD-10-CM | POA: Diagnosis not present

## 2022-10-13 DIAGNOSIS — J9600 Acute respiratory failure, unspecified whether with hypoxia or hypercapnia: Secondary | ICD-10-CM | POA: Diagnosis not present

## 2022-10-13 DIAGNOSIS — R7989 Other specified abnormal findings of blood chemistry: Secondary | ICD-10-CM | POA: Diagnosis not present

## 2022-10-13 DIAGNOSIS — E1142 Type 2 diabetes mellitus with diabetic polyneuropathy: Secondary | ICD-10-CM | POA: Diagnosis not present

## 2022-10-14 DIAGNOSIS — R000 Tachycardia, unspecified: Secondary | ICD-10-CM | POA: Diagnosis not present

## 2022-10-14 DIAGNOSIS — J9600 Acute respiratory failure, unspecified whether with hypoxia or hypercapnia: Secondary | ICD-10-CM | POA: Diagnosis not present

## 2022-10-14 DIAGNOSIS — E1142 Type 2 diabetes mellitus with diabetic polyneuropathy: Secondary | ICD-10-CM | POA: Diagnosis not present

## 2022-10-14 DIAGNOSIS — R7989 Other specified abnormal findings of blood chemistry: Secondary | ICD-10-CM | POA: Diagnosis not present

## 2022-10-15 DIAGNOSIS — J9620 Acute and chronic respiratory failure, unspecified whether with hypoxia or hypercapnia: Secondary | ICD-10-CM | POA: Diagnosis not present

## 2022-10-15 DIAGNOSIS — I214 Non-ST elevation (NSTEMI) myocardial infarction: Secondary | ICD-10-CM | POA: Diagnosis not present

## 2022-10-15 DIAGNOSIS — I429 Cardiomyopathy, unspecified: Secondary | ICD-10-CM | POA: Diagnosis not present

## 2022-10-15 DIAGNOSIS — R7989 Other specified abnormal findings of blood chemistry: Secondary | ICD-10-CM | POA: Diagnosis not present

## 2022-10-16 DIAGNOSIS — I214 Non-ST elevation (NSTEMI) myocardial infarction: Secondary | ICD-10-CM | POA: Diagnosis not present

## 2022-10-16 DIAGNOSIS — J9620 Acute and chronic respiratory failure, unspecified whether with hypoxia or hypercapnia: Secondary | ICD-10-CM | POA: Diagnosis not present

## 2022-10-16 DIAGNOSIS — R7989 Other specified abnormal findings of blood chemistry: Secondary | ICD-10-CM | POA: Diagnosis not present

## 2022-10-16 DIAGNOSIS — I429 Cardiomyopathy, unspecified: Secondary | ICD-10-CM | POA: Diagnosis not present

## 2022-10-17 DIAGNOSIS — I447 Left bundle-branch block, unspecified: Secondary | ICD-10-CM | POA: Diagnosis not present

## 2022-10-18 DIAGNOSIS — R7989 Other specified abnormal findings of blood chemistry: Secondary | ICD-10-CM | POA: Diagnosis not present

## 2022-10-18 DIAGNOSIS — I429 Cardiomyopathy, unspecified: Secondary | ICD-10-CM | POA: Diagnosis not present

## 2022-10-18 DIAGNOSIS — J9621 Acute and chronic respiratory failure with hypoxia: Secondary | ICD-10-CM | POA: Diagnosis not present

## 2022-10-18 DIAGNOSIS — I214 Non-ST elevation (NSTEMI) myocardial infarction: Secondary | ICD-10-CM | POA: Diagnosis not present

## 2022-10-20 DIAGNOSIS — I517 Cardiomegaly: Secondary | ICD-10-CM | POA: Diagnosis not present

## 2022-10-26 ENCOUNTER — Encounter: Payer: Self-pay | Admitting: Specialist

## 2022-10-28 ENCOUNTER — Encounter: Payer: Self-pay | Admitting: Internal Medicine

## 2022-10-31 ENCOUNTER — Encounter: Payer: Self-pay | Admitting: Cardiology

## 2022-11-18 DEATH — deceased

## 2022-11-26 IMAGING — MG MM BREAST LOCALIZATION CLIP
4 series · 4 of 12 positions shown · non-contrast
Comparison: Previous exam(s).

CLINICAL DATA: Evaluate post biopsy marker clip placement following
stereotactic core needle biopsy of left breast calcifications

EXAM:
3D DIAGNOSTIC LEFT MAMMOGRAM POST STEREOTACTIC BIOPSY

[L ML synth-2D]
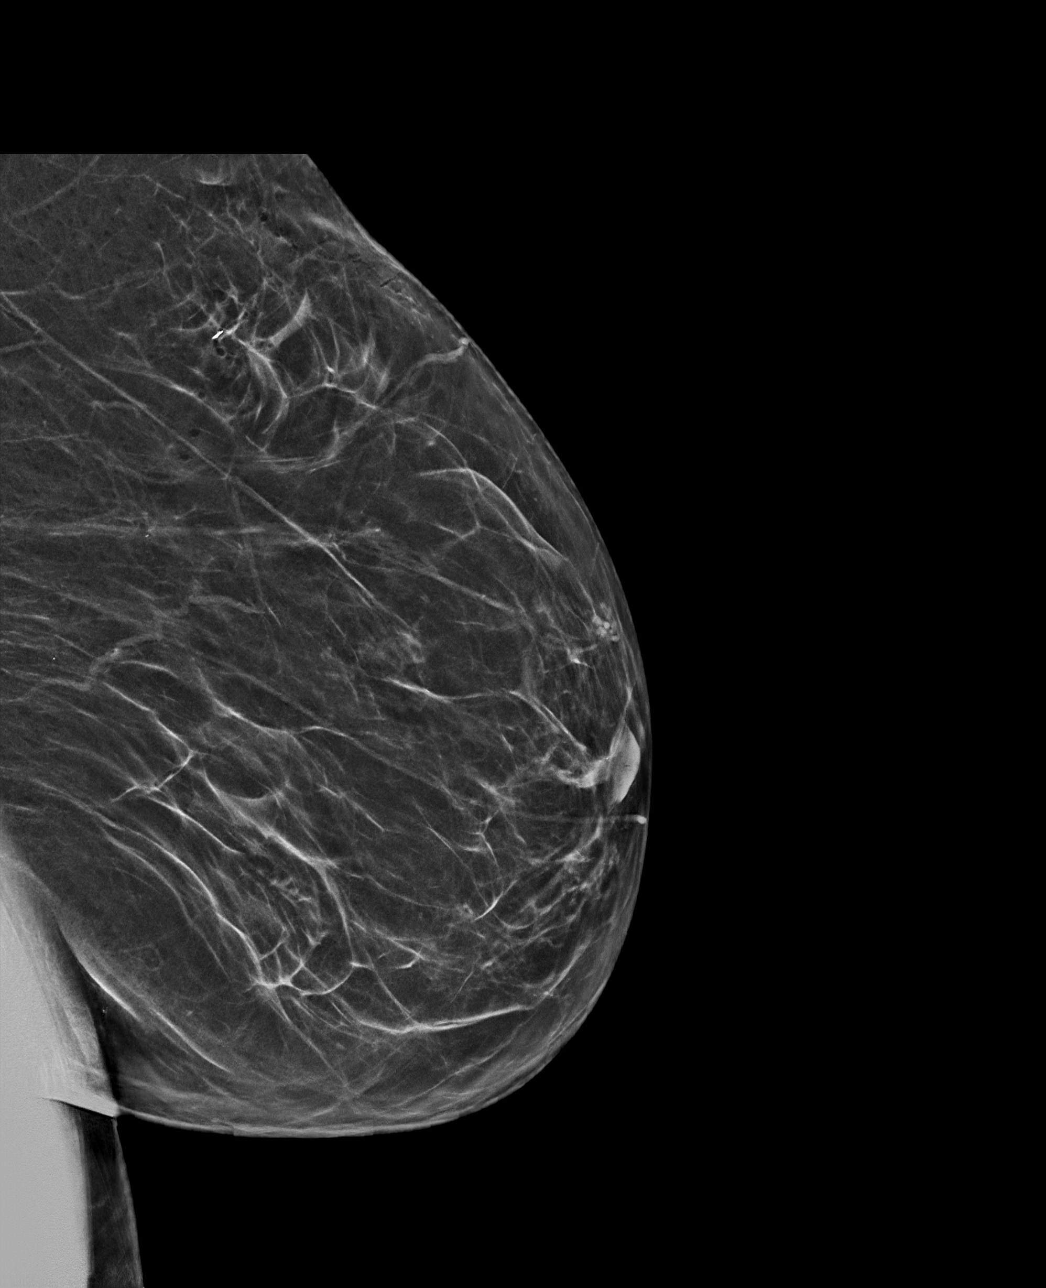

[L CC synth-2D]
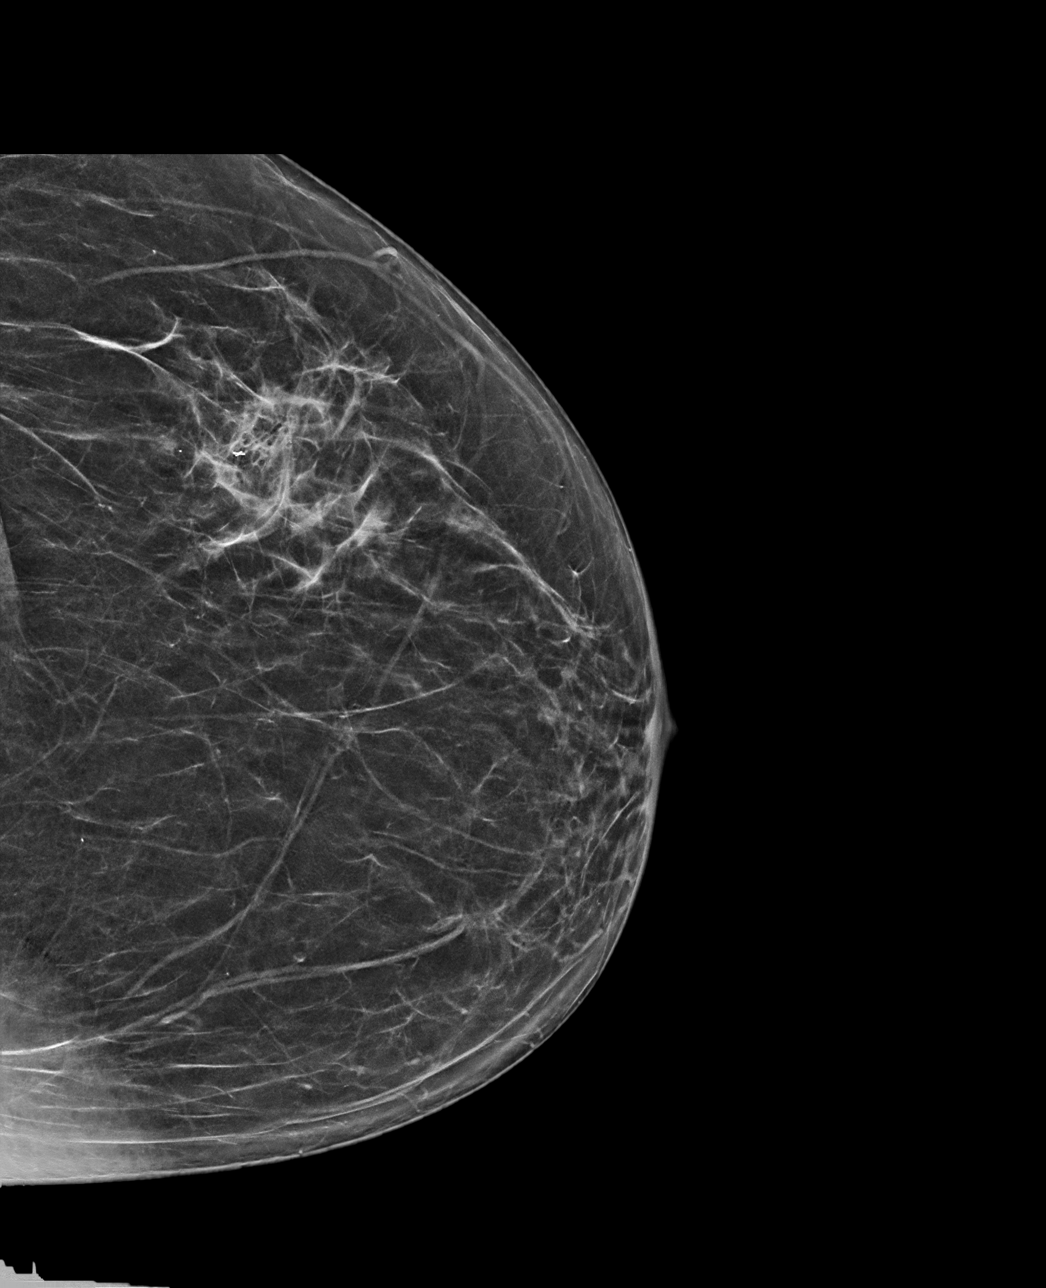

[L CC tomo · tomo slice 30/59.0]
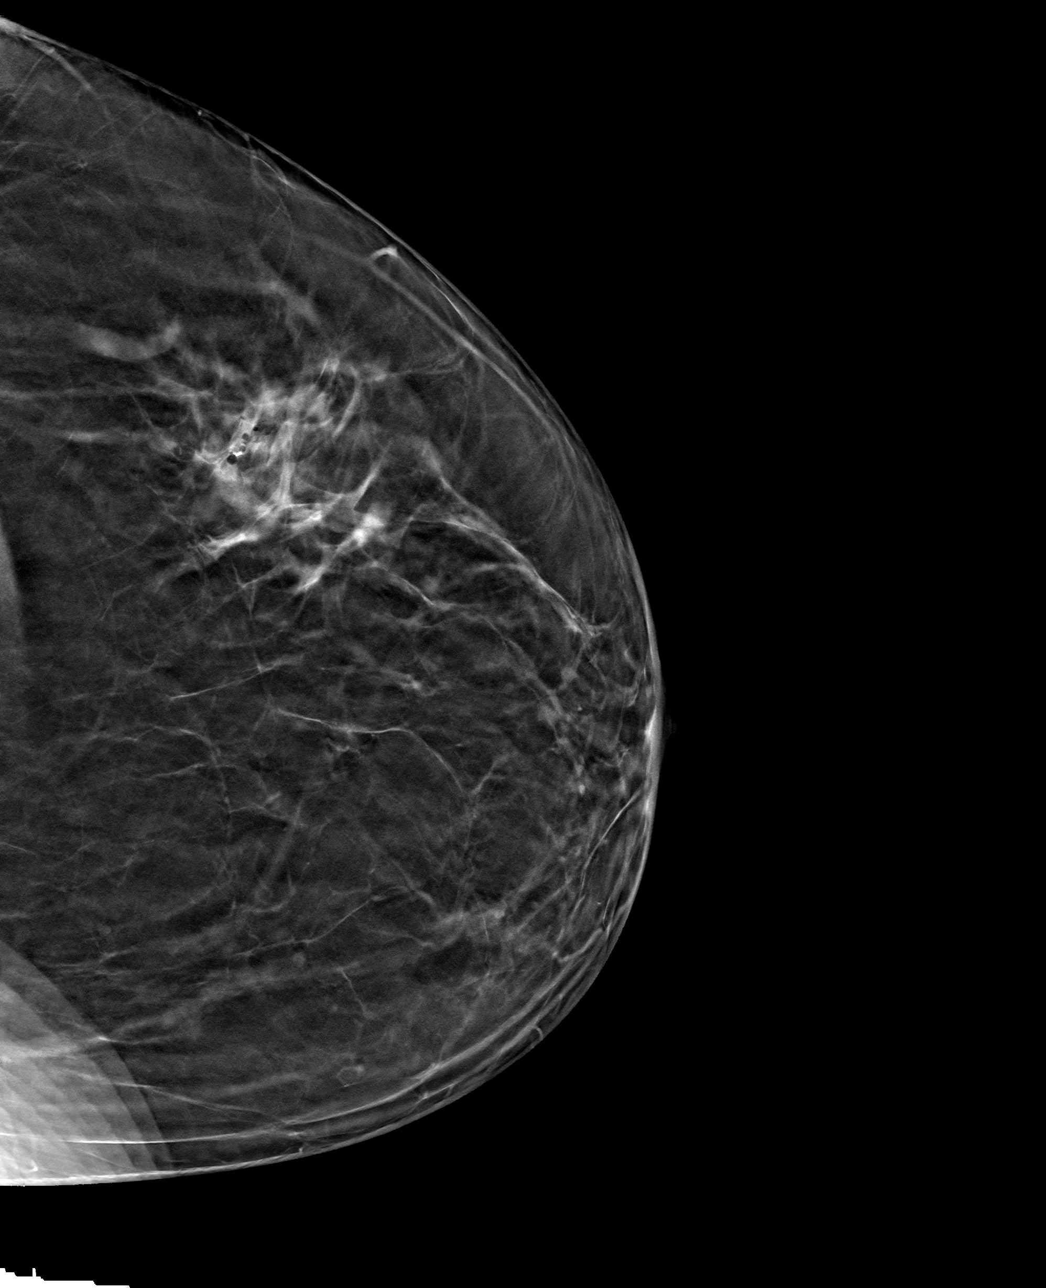

[L ML tomo · tomo slice 33/66.0]
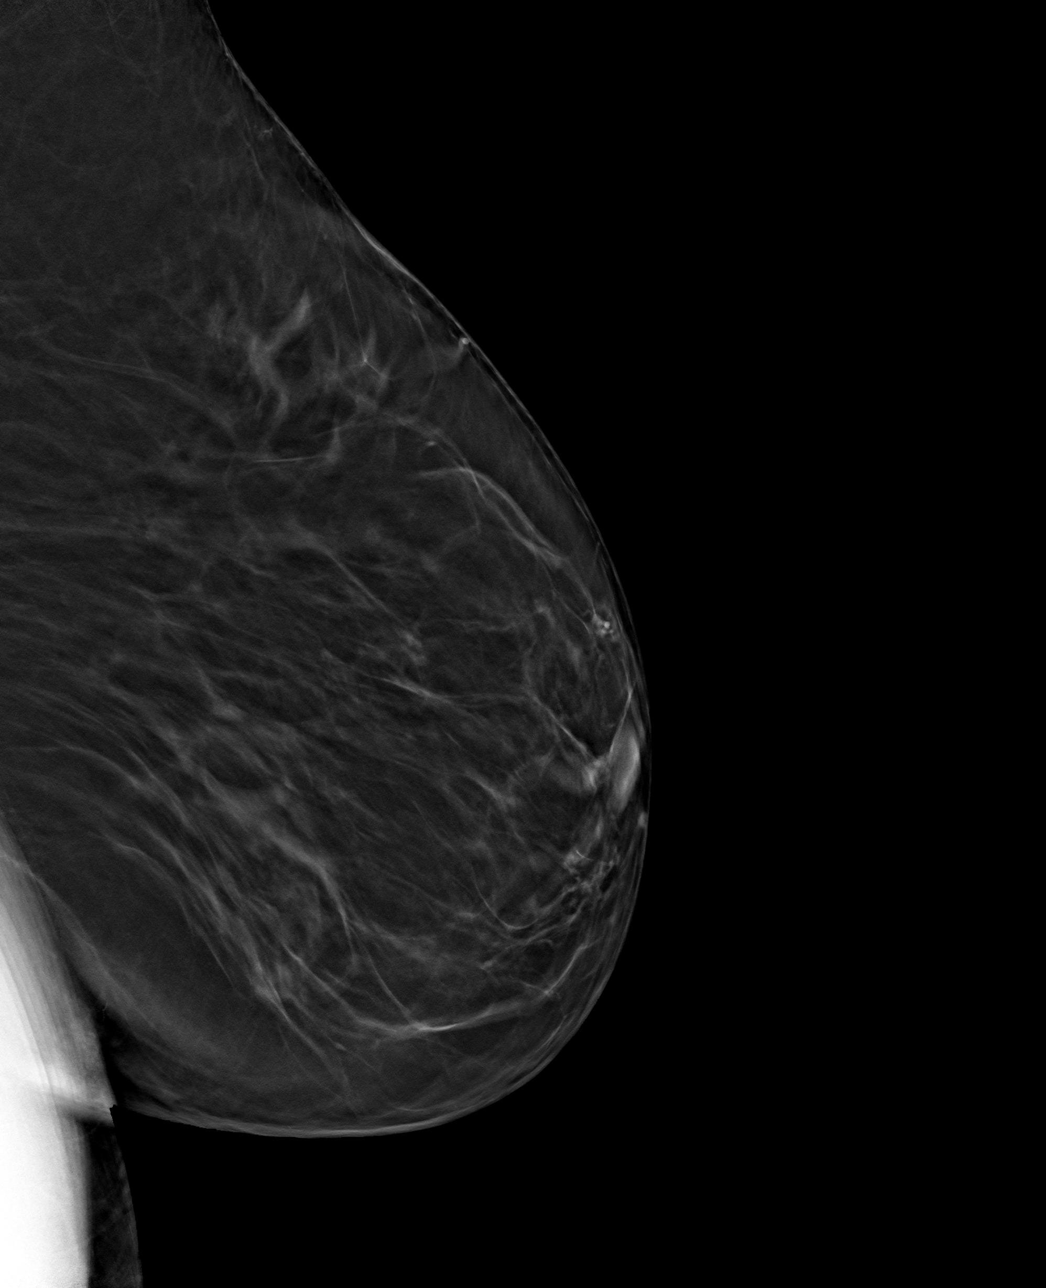

[4 of 12 positions shown; findings below may reference images not displayed]

FINDINGS: 3D Mammographic images were obtained following stereotactic guided
biopsy of left breast calcifications. The biopsy marking clip is in
expected position at the site of biopsy.
IMPRESSION: Appropriate positioning of the X shaped biopsy marking clip at the
site of biopsy in the in the upper outer left breast.

Final Assessment: Post Procedure Mammograms for Marker Placement
